# Patient Record
Sex: Male | Born: 1965 | Race: Black or African American | Hispanic: No | Smoking: Current every day smoker
Health system: Southern US, Community
[De-identification: ages and names within clinical notes are randomized; demographics above are authoritative.]

## PROBLEM LIST (undated history)

## (undated) DIAGNOSIS — I10 Essential (primary) hypertension: Secondary | ICD-10-CM

## (undated) DIAGNOSIS — K759 Inflammatory liver disease, unspecified: Secondary | ICD-10-CM

## (undated) HISTORY — PX: BRAIN SURGERY: SHX531

---

## 2006-05-15 ENCOUNTER — Emergency Department (HOSPITAL_COMMUNITY): Admission: EM | Admit: 2006-05-15 | Discharge: 2006-05-15 | Payer: Self-pay | Admitting: Emergency Medicine

## 2006-06-15 ENCOUNTER — Emergency Department (HOSPITAL_COMMUNITY): Admission: EM | Admit: 2006-06-15 | Discharge: 2006-06-15 | Payer: Self-pay | Admitting: Emergency Medicine

## 2006-07-24 ENCOUNTER — Emergency Department (HOSPITAL_COMMUNITY): Admission: EM | Admit: 2006-07-24 | Discharge: 2006-07-24 | Payer: Self-pay | Admitting: Emergency Medicine

## 2007-04-18 ENCOUNTER — Emergency Department (HOSPITAL_COMMUNITY): Admission: EM | Admit: 2007-04-18 | Discharge: 2007-04-18 | Payer: Self-pay | Admitting: Emergency Medicine

## 2007-10-20 ENCOUNTER — Emergency Department (HOSPITAL_COMMUNITY): Admission: EM | Admit: 2007-10-20 | Discharge: 2007-10-20 | Payer: Self-pay | Admitting: Emergency Medicine

## 2008-04-07 ENCOUNTER — Emergency Department (HOSPITAL_COMMUNITY): Admission: EM | Admit: 2008-04-07 | Discharge: 2008-04-07 | Payer: Self-pay | Admitting: Emergency Medicine

## 2008-08-28 ENCOUNTER — Emergency Department (HOSPITAL_COMMUNITY): Admission: EM | Admit: 2008-08-28 | Discharge: 2008-08-28 | Payer: Self-pay | Admitting: Emergency Medicine

## 2008-09-02 ENCOUNTER — Emergency Department (HOSPITAL_COMMUNITY): Admission: EM | Admit: 2008-09-02 | Discharge: 2008-09-02 | Payer: Self-pay | Admitting: Emergency Medicine

## 2008-09-03 ENCOUNTER — Emergency Department (HOSPITAL_COMMUNITY): Admission: EM | Admit: 2008-09-03 | Discharge: 2008-09-03 | Payer: Self-pay | Admitting: Emergency Medicine

## 2008-09-05 ENCOUNTER — Emergency Department (HOSPITAL_COMMUNITY): Admission: EM | Admit: 2008-09-05 | Discharge: 2008-09-05 | Payer: Self-pay | Admitting: Emergency Medicine

## 2008-09-12 ENCOUNTER — Ambulatory Visit: Payer: Self-pay | Admitting: Family Medicine

## 2008-09-12 DIAGNOSIS — G8929 Other chronic pain: Secondary | ICD-10-CM | POA: Insufficient documentation

## 2008-09-12 DIAGNOSIS — M79604 Pain in right leg: Secondary | ICD-10-CM

## 2008-09-12 DIAGNOSIS — M79605 Pain in left leg: Secondary | ICD-10-CM

## 2008-09-13 ENCOUNTER — Emergency Department (HOSPITAL_COMMUNITY): Admission: EM | Admit: 2008-09-13 | Discharge: 2008-09-13 | Payer: Self-pay | Admitting: Emergency Medicine

## 2008-11-15 ENCOUNTER — Emergency Department (HOSPITAL_COMMUNITY): Admission: EM | Admit: 2008-11-15 | Discharge: 2008-11-15 | Payer: Self-pay | Admitting: Emergency Medicine

## 2008-11-19 ENCOUNTER — Emergency Department (HOSPITAL_COMMUNITY): Admission: EM | Admit: 2008-11-19 | Discharge: 2008-11-19 | Payer: Self-pay | Admitting: Emergency Medicine

## 2008-12-07 ENCOUNTER — Emergency Department (HOSPITAL_COMMUNITY): Admission: EM | Admit: 2008-12-07 | Discharge: 2008-12-07 | Payer: Self-pay | Admitting: Emergency Medicine

## 2008-12-22 ENCOUNTER — Emergency Department (HOSPITAL_COMMUNITY): Admission: EM | Admit: 2008-12-22 | Discharge: 2008-12-22 | Payer: Self-pay | Admitting: Emergency Medicine

## 2010-10-13 LAB — GLUCOSE, CAPILLARY: Glucose-Capillary: 85 mg/dL (ref 70–99)

## 2010-10-16 LAB — URINE MICROSCOPIC-ADD ON

## 2010-10-16 LAB — URINALYSIS, ROUTINE W REFLEX MICROSCOPIC
Ketones, ur: NEGATIVE mg/dL
Nitrite: NEGATIVE
Urobilinogen, UA: 1 mg/dL (ref 0.0–1.0)

## 2012-03-25 ENCOUNTER — Emergency Department (HOSPITAL_COMMUNITY)
Admission: EM | Admit: 2012-03-25 | Discharge: 2012-03-25 | Disposition: A | Discharge status: Home / Self Care | Payer: No Typology Code available for payment source | Attending: Emergency Medicine | Admitting: Emergency Medicine

## 2012-03-25 ENCOUNTER — Encounter (HOSPITAL_COMMUNITY): Payer: Self-pay | Admitting: Emergency Medicine

## 2012-03-25 DIAGNOSIS — Y93I9 Activity, other involving external motion: Secondary | ICD-10-CM | POA: Insufficient documentation

## 2012-03-25 DIAGNOSIS — Y998 Other external cause status: Secondary | ICD-10-CM | POA: Insufficient documentation

## 2012-03-25 DIAGNOSIS — F17200 Nicotine dependence, unspecified, uncomplicated: Secondary | ICD-10-CM | POA: Insufficient documentation

## 2012-03-25 DIAGNOSIS — S39012A Strain of muscle, fascia and tendon of lower back, initial encounter: Secondary | ICD-10-CM

## 2012-03-25 DIAGNOSIS — S339XXA Sprain of unspecified parts of lumbar spine and pelvis, initial encounter: Secondary | ICD-10-CM | POA: Insufficient documentation

## 2012-03-25 DIAGNOSIS — Y92410 Unspecified street and highway as the place of occurrence of the external cause: Secondary | ICD-10-CM | POA: Insufficient documentation

## 2012-03-25 MED ORDER — TRAMADOL HCL 50 MG PO TABS
50.0000 mg | ORAL_TABLET | Freq: Once | ORAL | Status: AC
  Administered 2012-03-25: 50 mg via ORAL
  Filled 2012-03-25: qty 1

## 2012-03-25 MED ORDER — TRAMADOL HCL 50 MG PO TABS: 50.0000 mg | ORAL_TABLET | Freq: Once | ORAL | Status: DC

## 2012-03-25 NOTE — ED Notes (Signed)
NP at bedside.
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
## 2012-03-25 NOTE — ED Notes (Signed)
Verified with Manus Rudd, NP Tramadol safe to [employer]

## 2012-03-27 NOTE — ED Provider Notes (Signed)
Medical screening examination/treatment/procedure(s) were performed by non-physician practitioner and as supervising physician I was immediately available for consultation/collaboration.  Flint Melter, MD 03/27/12 [phone]

## 2012-09-15 ENCOUNTER — Emergency Department (HOSPITAL_COMMUNITY): Payer: No Typology Code available for payment source

## 2012-09-15 ENCOUNTER — Emergency Department (HOSPITAL_COMMUNITY)
Admission: EM | Admit: 2012-09-15 | Discharge: 2012-09-15 | Disposition: A | Discharge status: Home / Self Care | Payer: No Typology Code available for payment source | Attending: Emergency Medicine | Admitting: Emergency Medicine

## 2012-09-15 ENCOUNTER — Encounter (HOSPITAL_COMMUNITY): Payer: Self-pay | Admitting: *Deleted

## 2012-09-15 DIAGNOSIS — S0993XA Unspecified injury of face, initial encounter: Secondary | ICD-10-CM | POA: Insufficient documentation

## 2012-09-15 DIAGNOSIS — I10 Essential (primary) hypertension: Secondary | ICD-10-CM

## 2012-09-15 DIAGNOSIS — M542 Cervicalgia: Secondary | ICD-10-CM

## 2012-09-15 DIAGNOSIS — F17200 Nicotine dependence, unspecified, uncomplicated: Secondary | ICD-10-CM | POA: Insufficient documentation

## 2012-09-15 DIAGNOSIS — R51 Headache: Secondary | ICD-10-CM | POA: Insufficient documentation

## 2012-09-15 DIAGNOSIS — Y92410 Unspecified street and highway as the place of occurrence of the external cause: Secondary | ICD-10-CM | POA: Insufficient documentation

## 2012-09-15 DIAGNOSIS — Y939 Activity, unspecified: Secondary | ICD-10-CM | POA: Insufficient documentation

## 2012-09-15 MED ORDER — OXYCODONE-ACETAMINOPHEN 5-325 MG PO TABS: 2.0000 | ORAL_TABLET | ORAL | Status: DC | PRN
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
## 2014-04-03 ENCOUNTER — Telehealth (HOSPITAL_COMMUNITY): Payer: Self-pay | Admitting: *Deleted

## 2014-04-25 ENCOUNTER — Telehealth (HOSPITAL_COMMUNITY): Payer: Self-pay | Admitting: *Deleted

## 2014-12-10 ENCOUNTER — Other Ambulatory Visit: Payer: Self-pay

## 2014-12-10 ENCOUNTER — Encounter (HOSPITAL_COMMUNITY): Payer: Self-pay

## 2014-12-10 ENCOUNTER — Emergency Department (HOSPITAL_COMMUNITY)
Admission: EM | Admit: 2014-12-10 | Discharge: 2014-12-10 | Disposition: A | Discharge status: Home / Self Care | Payer: No Typology Code available for payment source | Attending: Emergency Medicine | Admitting: Emergency Medicine

## 2014-12-10 ENCOUNTER — Emergency Department (HOSPITAL_COMMUNITY): Payer: No Typology Code available for payment source

## 2014-12-10 DIAGNOSIS — R5383 Other fatigue: Secondary | ICD-10-CM | POA: Diagnosis not present

## 2014-12-10 DIAGNOSIS — I159 Secondary hypertension, unspecified: Secondary | ICD-10-CM | POA: Diagnosis not present

## 2014-12-10 DIAGNOSIS — R079 Chest pain, unspecified: Secondary | ICD-10-CM | POA: Insufficient documentation

## 2014-12-10 DIAGNOSIS — Z760 Encounter for issue of repeat prescription: Secondary | ICD-10-CM | POA: Diagnosis not present

## 2014-12-10 DIAGNOSIS — R0602 Shortness of breath: Secondary | ICD-10-CM | POA: Diagnosis not present

## 2014-12-10 HISTORY — DX: Essential (primary) hypertension: I10

## 2014-12-10 LAB — CBC WITH DIFFERENTIAL/PLATELET
Basophils Absolute: 0 10*3/uL (ref 0.0–0.1)
Basophils Relative: 0 % (ref 0–1)
Eosinophils Absolute: 0.1 10*3/uL (ref 0.0–0.7)
Eosinophils Relative: 3 % (ref 0–5)
HCT: 42.9 % (ref 39.0–52.0)
Hemoglobin: 14.7 g/dL (ref 13.0–17.0)
Lymphocytes Relative: 57 % — ABNORMAL HIGH (ref 12–46)
Lymphs Abs: 2.1 10*3/uL (ref 0.7–4.0)
MCH: 30.9 pg (ref 26.0–34.0)
MCHC: 34.3 g/dL (ref 30.0–36.0)
MCV: 90.1 fL (ref 78.0–100.0)
Monocytes Absolute: 0.4 10*3/uL (ref 0.1–1.0)
Monocytes Relative: 11 % (ref 3–12)
Neutro Abs: 1.1 10*3/uL — ABNORMAL LOW (ref 1.7–7.7)
Neutrophils Relative %: 30 % — ABNORMAL LOW (ref 43–77)
Platelets: 194 10*3/uL (ref 150–400)
RBC: 4.76 MIL/uL (ref 4.22–5.81)
RDW: 12.8 % (ref 11.5–15.5)
WBC: 3.7 10*3/uL — ABNORMAL LOW (ref 4.0–10.5)

## 2014-12-10 LAB — BASIC METABOLIC PANEL
Anion gap: 8 (ref 5–15)
BUN: 18 mg/dL (ref 6–20)
CALCIUM: 9.2 mg/dL (ref 8.9–10.3)
CHLORIDE: 105 mmol/L (ref 101–111)
CO2: 24 mmol/L (ref 22–32)
CREATININE: 1.24 mg/dL (ref 0.61–1.24)
GFR calc non Af Amer: >60 mL/min (ref >60)
GLUCOSE: 90 mg/dL (ref 65–99)
POTASSIUM: 4 mmol/L (ref 3.5–5.1)
Sodium: 137 mmol/L (ref 135–145)

## 2014-12-10 LAB — BRAIN NATRIURETIC PEPTIDE: B Natriuretic Peptide: 15.9 pg/mL (ref 0.0–100.0)

## 2014-12-10 LAB — I-STAT TROPONIN, ED
TROPONIN I, POC: 0 ng/mL (ref 0.00–0.08)
Troponin i, poc: 0 ng/mL (ref 0.00–0.08)

## 2014-12-10 LAB — CBC
HCT: 41.8 % (ref 39.0–52.0)
HEMOGLOBIN: 14.4 g/dL (ref 13.0–17.0)
MCH: 30.8 pg (ref 26.0–34.0)
MCHC: 34.4 g/dL (ref 30.0–36.0)
MCV: 89.5 fL (ref 78.0–100.0)
Platelets: 200 10*3/uL (ref 150–400)
RBC: 4.67 MIL/uL (ref 4.22–5.81)
RDW: 12.8 % (ref 11.5–15.5)
WBC: 2.8 10*3/uL — ABNORMAL LOW (ref 4.0–10.5)

## 2014-12-10 MED ORDER — HYDROCHLOROTHIAZIDE 25 MG PO TABS
25.0000 mg | ORAL_TABLET | Freq: Once | ORAL | Status: AC
  Administered 2014-12-10: 25 mg via ORAL
  Filled 2014-12-10: qty 1

## 2014-12-10 MED ORDER — KETOROLAC TROMETHAMINE 30 MG/ML IJ SOLN
30.0000 mg | Freq: Once | INTRAMUSCULAR | Status: AC
  Administered 2014-12-10: 30 mg via INTRAVENOUS
  Filled 2014-12-10: qty 1

## 2014-12-10 MED ORDER — SODIUM CHLORIDE 0.9 % IV BOLUS (SEPSIS)
1000.0000 mL | Freq: Once | INTRAVENOUS | Status: AC
  Administered 2014-12-10: 1000 mL via INTRAVENOUS

## 2014-12-10 MED ORDER — HYDROCHLOROTHIAZIDE 25 MG PO TABS: 25.0000 mg | ORAL_TABLET | Freq: Every day | ORAL | Status: DC

## 2014-12-10 NOTE — ED Notes (Signed)
Pt states he was jogging last night and started having left sided cp with some SOB. Nausea at times and a headache at present

## 2014-12-10 NOTE — ED Provider Notes (Signed)
CSN: [id number]     Arrival date & time 12/10/14  1425 History   First MD Initiated Contact with Patient 12/10/14 1910     Chief Complaint  Patient presents with  . Chest Pain     (Consider location/radiation/quality/duration/timing/severity/associated sxs/prior Treatment) HPI Pt is a 49yo male with hx of HTN, presenting to ED with c/o left sided chest pain with associated SOB yesterday while he was jogging last night.  Pain was sharp and aching, 5/10 at worst, lasted several minutes but resolved with rest.  Pt states this morning when he woke up he had generalized fatigue and generalized headache that was gradual in onset so he called out of work.  HA feels similar to previous. Pt does state he feels the SOB yesterday was due to smoking as he smokes cigarettes daily.  Denies CP or SOB today.  Denies prior hx of CAD. Denies hx of asthma, DVT or PE.  Denies fever, chills, n/v/d. Denies recent travel or sick contacts. No known tick bites. Denies rashes.   Pt found to have elevated BP in triage, BP-170/109.  Pt states he has been off his BP medication for at least 2 months as he states he works a lot and has not made time to f/u with his PCP.     Past Medical History  Diagnosis Date  . Hypertension    History reviewed. No pertinent past surgical history. No family history on file. History  Substance Use Topics  . Smoking status: Current Every Day Smoker  . Smokeless tobacco: Never Used  . Alcohol Use: Yes    Review of Systems  Constitutional: Positive for fatigue. Negative for fever, chills, diaphoresis and appetite change.  Respiratory: Positive for shortness of breath. Negative for cough and stridor.   Cardiovascular: Positive for chest pain. Negative for palpitations and leg swelling.  Gastrointestinal: Negative for nausea, vomiting, abdominal pain and diarrhea.  Musculoskeletal: Negative for myalgias and arthralgias.  Skin: Negative for rash.  All other systems reviewed and are  negative.     Allergies  Codeine  Home Medications   Prior to Admission medications   Medication Sig Start Date End Date Taking? Authorizing Provider  acetaminophen (TYLENOL) 500 MG tablet Take 500 mg by mouth every 6 (six) hours as needed for pain.   Yes Historical Provider, MD  ibuprofen (ADVIL,MOTRIN) 200 MG tablet Take 400 mg by mouth every 6 (six) hours as needed for mild pain.   Yes Historical Provider, MD  Tetrahydrozoline HCl (VISINE OP) Apply 1 drop to eye daily as needed (for dry eyes).   Yes Historical Provider, MD  hydrochlorothiazide (HYDRODIURIL) 25 MG tablet Take 1 tablet (25 mg total) by mouth daily. 12/10/14   Junius Finner, PA-C   BP 167/98 mmHg  Pulse 66  Temp(Src) 98.2 F (36.8 C) (Oral)  Resp 20  Ht  (1.803 m)  Wt 184 lb 9.6 oz (83.734 kg)  BMI 25.76 kg/m2  SpO2 99% Physical Exam  Constitutional: He appears well-developed and well-nourished.  Pt lying in exam bed, appears well, NAD  HENT:  Head: Normocephalic and atraumatic.  Eyes: Conjunctivae are normal. No scleral icterus.  Neck: Normal range of motion. Neck supple.  Cardiovascular: Normal rate, regular rhythm and normal heart sounds.   Pulmonary/Chest: Effort normal and breath sounds normal. No respiratory distress. He has no wheezes. He has no rales. He exhibits no tenderness.  Abdominal: Soft. Bowel sounds are normal. He exhibits no distension and no mass. There is no tenderness.  There is no rebound and no guarding.  Musculoskeletal: Normal range of motion.  Neurological: He is alert.  Skin: Skin is warm and dry.  Nursing note and vitals reviewed.   ED Course  Procedures (including critical care time) Labs Review Labs Reviewed  CBC - Abnormal; Notable for the following:    WBC 2.8 (*)    All other components within normal limits  CBC WITH DIFFERENTIAL/PLATELET - Abnormal; Notable for the following:    WBC 3.7 (*)    Neutrophils Relative % 30 (*)    Neutro Abs 1.1 (*)    Lymphocytes  Relative 57 (*)    All other components within normal limits  BASIC METABOLIC PANEL  BRAIN NATRIURETIC PEPTIDE  I-STAT TROPOININ, ED  I-STAT TROPOININ, ED    Imaging Review Dg Chest 2 View  12/10/2014   CLINICAL DATA:  Left-sided chest pain shortness of breath wall jogging last night. Nausea at times. Headache.  EXAM: CHEST  2 VIEW  COMPARISON:  09/13/2008  FINDINGS: The cardiomediastinal silhouette is within normal limits. The lungs are well inflated and clear. There is no evidence of pleural effusion or pneumothorax. No acute osseous abnormality is identified.  IMPRESSION: No active cardiopulmonary disease.   Electronically Signed   By: Sebastian AcheAllen  Grady   On: 12/10/2014 15:42     EKG Interpretation   Date/Time:  Monday December 10 2014 14:41:55 EDT Ventricular Rate:  84 PR Interval:  130 QRS Duration: 86 QT Interval:  366 QTC Calculation: 432 R Axis:   81 Text Interpretation:  Normal sinus rhythm Right atrial enlargement  Borderline ECG No old tracing to compare Confirmed by BELFI  MD, MELANIE  ([id number]) on 12/10/2014 8:16:49 PM      MDM   Final diagnoses:  Chest pain, unspecified chest pain type  Secondary hypertension, unspecified  Medication refill    Pt is a 49yo male hx of HTN, c/o CP yesterday and generalized fatigue today.  Mild HA today that was gradual in onset. [facility]. No focal neuro deficit. Not concerned for CVA/TIA.  Pt is low risk for ASC per HEART score. PERC negative.  EKG: NSR istat troponin negative x2 Initial CBC with WBC was 2.8, repeat labs of CBC with Diff, WBC-3.7, only mild leuopenia, doubt cause of pt's fatigue.  Pt was hypertensive in triage with BP of 170/109, BP improved to 167/98 while in ED. Pt reports being off his medication for at least 2 months due to work being busy, he has not had time to f/u with PCP.   Pt is safe for discharge home. No evidence of emergent process taking place at this time. Pt hemodynamically stable. Filed  Vitals:   12/10/14 1919  BP: 167/98  Pulse: 66  Temp: Temp: 98.40F at 17:08  Resp: 20   Strongly encouraged pt to f/u with PCP for ongoing healthcare needs including continued maintenance of his BP. Return precautions provided. Pt verbalized understanding and agreement with tx plan.     Junius Finnerrin O'Malley, PA-C 12/10/14 2034  Rolan BuccoMelanie Belfi, MD 12/10/14 [phone]

## 2014-12-10 NOTE — Discharge Instructions (Signed)

## 2017-04-27 ENCOUNTER — Ambulatory Visit (HOSPITAL_COMMUNITY)
Admission: RE | Admit: 2017-04-27 | Discharge: 2017-04-27 | Disposition: A | Discharge status: Home / Self Care | Payer: Medicaid Other | Source: Ambulatory Visit | Attending: Family Medicine | Admitting: Family Medicine

## 2017-04-27 ENCOUNTER — Ambulatory Visit (INDEPENDENT_AMBULATORY_CARE_PROVIDER_SITE_OTHER): Payer: Self-pay | Admitting: Family Medicine

## 2017-04-27 ENCOUNTER — Encounter: Payer: Self-pay | Admitting: Family Medicine

## 2017-04-27 VITALS — BP 140/100 | HR 83 | Temp 97.6°F | Ht 71.0 in | Wt 205.0 lb

## 2017-04-27 DIAGNOSIS — M79604 Pain in right leg: Secondary | ICD-10-CM

## 2017-04-27 DIAGNOSIS — R071 Chest pain on breathing: Secondary | ICD-10-CM

## 2017-04-27 DIAGNOSIS — Z7689 Persons encountering health services in other specified circumstances: Secondary | ICD-10-CM | POA: Insufficient documentation

## 2017-04-27 DIAGNOSIS — G8929 Other chronic pain: Secondary | ICD-10-CM

## 2017-04-27 DIAGNOSIS — R918 Other nonspecific abnormal finding of lung field: Secondary | ICD-10-CM | POA: Insufficient documentation

## 2017-04-27 DIAGNOSIS — M79605 Pain in left leg: Secondary | ICD-10-CM

## 2017-04-27 DIAGNOSIS — I10 Essential (primary) hypertension: Secondary | ICD-10-CM | POA: Insufficient documentation

## 2017-04-27 DIAGNOSIS — F17210 Nicotine dependence, cigarettes, uncomplicated: Secondary | ICD-10-CM

## 2017-04-27 NOTE — Assessment & Plan Note (Signed)
Previously treated with ibuprofen and tylenol. We discussed having a separate appointment for this in the future after work up for his chest pain. He rated this as the most important thing for him to address at the clinic today.

## 2017-04-27 NOTE — Assessment & Plan Note (Signed)
Atypical chestpain. Seems pleuritic in nature. Uncontrolled hypertension raises concerns for increased risk for CAD and ACS event. Possibly angina, but seems to be very active with work without inciting pain. CHF can cause pleuritic chest pain. Infection such as PNA seems unlikely given no focal lung findings on exam and no fever or cough. PE can also not be ruled out, although he denies any lower extremity pain/seems very mobile so risk for DVT seems very low. GERD can also intermittent chest pain, but not usually pleuritic. Will get a chest xray to rule out cardiomegaly, PNA, hiatal hernia, malignancy. Will have pt come back in 1 week to further explore. Consider EKG, BNP.

## 2017-04-27 NOTE — Assessment & Plan Note (Signed)
Patient endorsed wanting to quit. Motivation father died of lung cancer from smoking and aunt died of COPD. Possible evidence of COPD from prior cxr, but did not appreciate bronchovesicular breath sounds on exam. I would very much like to explore smoking cessation options at a future visit dedicated to this.

## 2017-04-27 NOTE — Assessment & Plan Note (Addendum)
Elevated BP today at 140/100. Will have pt come back in 1 week to discuss restarting HCTZ vs a new agent.

## 2017-04-27 NOTE — Patient Instructions (Signed)
It was a pleasure to see you today! Thank you for choosing [facility] for your primary care. Lawrence Cole was seen to establish care. [name]. Come back to the clinic in one week to further discuss your chest pain and to address your hypertension.  Best,  Thomes DinningBrad Azhane Eckart, MD, MS FAMILY MEDICINE RESIDENT - PGY1 04/27/2017 10:53 AM   Lab Orders     Basic Metabolic Panel     Lipid panel     HIV antibody (with reflex)     Hepatitis C Antibody

## 2017-04-27 NOTE — Progress Notes (Signed)
Subjective:  Lawrence Cole is a 51 y.o. male who presents to the [facility] today to establish care.   HPI: HTN: Diagnosed approximately 2 years ago. Previously on HCTZ. Has been unmediated for 2 years.   Chest pain: Pain in chest with deep breathing in midchest. H/o smoking cigarrettes. Chest pain Been going on for past 3-4 months. Not getting worse. Pain is worse when smoking a cigarette and deep breaths. [place] for [place]. 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 Does not have any chest pain during this time.   H/o of headache. No headache currently. Daily. No vision changes. No vomiting with h/a.   Smoking: ~25 pack year history of smoking. Currently smoking. Father died of lung cancer. Aunt had COPD. CXR from 2 years ago did not have signs of acute cardiopulmonary disease. Does appear to have hyperinflated lungs with some diaphragmatic flattening and blunting of the corners. Interested in quiting smoking  ROS: As per HPI, otherwise all systems reviewed and are negative  PMH:  The following were reviewed and entered/updated in epic: Past Medical History:  Diagnosis Date  . Hypertension    Patient Active Problem List   Diagnosis Date Noted  . Chest pain on breathing 04/27/2017  . Essential hypertension 04/27/2017  . Establishing care with new doctor, encounter for 04/27/2017  . Smoking greater than 25 pack years 04/27/2017  . Chronic pain of lower extremity, bilateral 09/12/2008   Past Surgical History:  Procedure Laterality Date  . BRAIN SURGERY Right    right temporal plate and maxillary hardware?    Family History  Problem Relation Age of Onset  . Asthma Mother   . Diabetes Mother   . Hypertension Mother   . Cancer Father   . Hypertension Father     Medications- reviewed and updated Current Outpatient Prescriptions  Medication Sig Dispense Refill  . acetaminophen (TYLENOL) 500 MG tablet Take 500 mg by  mouth every 6 (six) hours as needed for pain.    . hydrochlorothiazide (HYDRODIURIL) 25 MG tablet Take 1 tablet (25 mg total) by mouth daily. 30 tablet 1  . ibuprofen (ADVIL,MOTRIN) 200 MG tablet Take 400 mg by mouth every 6 (six) hours as needed for mild pain.    . Tetrahydrozoline HCl (VISINE OP) Apply 1 drop to eye daily as needed (for dry eyes).     No current facility-administered medications for this visit.     Allergies-reviewed and updated Allergies  Allergen Reactions  . Codeine Itching    Social History   Social History  . Marital status: Divorced    Spouse name: N/A  . Number of children: N/A  . Years of education: N/A   Social History Main Topics  . Smoking status: Current Every Day Smoker    Packs/day: 1.00    Years: 25.00  . Smokeless tobacco: Never Used  . Alcohol use Yes     Comment: 4/day  . Drug use: Yes    Frequency: 14.0 times per week    Types: Marijuana     Comment: joints. Been doing for 15-20 years.   . Sexual activity: Yes    Birth control/ protection: Condom     Comment: With women   Other Topics Concern  . None   Social History Narrative  . None     Objective:  Physical Exam: BP (!) 140/100 (BP Location: Left Arm, Patient Position: Sitting, Cuff Size: Normal)   Pulse  83   Temp 97.6 F (36.4 C) (Oral)   Ht 5\' 11"  (1.803 m)   Wt 205 lb (93 kg)   SpO2 97%   BMI 28.59 kg/m   Gen: NAD, resting comfortably CV: RRR with no murmurs appreciated, 2+ dp, <3 sec cap refill. No clubbing of the nails Pulm: NWOB, CTAB with no crackles, wheezes, or rhonchi GI: Normal bowel sounds present. Soft, Nontender, Nondistended. MSK: no edema, cyanosis, or clubbing noted Skin: warm, dry Neuro: grossly normal, moves all extremities Psych: Normal affect and thought content  No results found for this or any previous visit (from the past 72 hour(s)).   Assessment/Plan:  Essential hypertension Elevated BP today at 140/100. Will have pt come back in 1  week to discuss starting a knew agent.   Chronic pain of lower extremity, bilateral Previously treated with ibuprofen and tylenol. We discussed having a separate appointment for this in the future after work up for his chest pain. He rated this as the most important thing for him to address at the clinic today.   Chest pain on breathing Atypical chestpain. Seems pleuritic in nature. Uncontrolled hypertension raises concerns for increased risk for CAD and ACS event. Possibly angina, but seems to be very active with work without inciting pain. CHF can cause pleuritic chest pain. Infection such as PNA seems unlikely given no focal lung findings on exam and no fever or cough. PE can also not be ruled out, although he denies any lower extremity pain/seems very mobile so risk for DVT seems very low. GERD can also intermittent chest pain, but not usually pleuritic. Will get a chest xray to rule out cardiomegaly, PNA, hiatal hernia, malignancy. Will have pt come back in 1 week to further explore. Consider EKG, BNP.   Smoking greater than 25 pack years Patient endorsed wanting to quit. Motivation father died of lung cancer from smoking and aunt died of COPD. Possible evidence of COPD from prior cxr, but did not appreciate bronchovesicular breath sounds on exam. I would very much like to explore smoking cessation options at a future visit dedicated to this.     Lab Orders     Basic Metabolic Panel     Lipid panel     HIV antibody (with reflex)     Hepatitis C Antibody  No orders of the defined types were placed in this encounter.   Thomes Dinning, MD, MS FAMILY MEDICINE RESIDENT - PGY1 04/27/2017 12:39 PM

## 2017-04-28 LAB — BASIC METABOLIC PANEL
BUN / CREAT RATIO: 14 (ref 9–20)
BUN: 23 mg/dL (ref 6–24)
CO2: 26 mmol/L (ref 20–29)
CREATININE: 1.59 mg/dL — AB (ref 0.76–1.27)
Calcium: 9.4 mg/dL (ref 8.7–10.2)
Chloride: 103 mmol/L (ref 96–106)
GFR, EST AFRICAN AMERICAN: 56 mL/min/{1.73_m2} — AB (ref >59)
GFR, EST NON AFRICAN AMERICAN: 49 mL/min/{1.73_m2} — AB (ref >59)
GLUCOSE: 89 mg/dL (ref 65–99)
Potassium: 4.3 mmol/L (ref 3.5–5.2)
SODIUM: 143 mmol/L (ref 134–144)

## 2017-04-28 LAB — LIPID PANEL
CHOLESTEROL TOTAL: 136 mg/dL (ref 100–199)
Chol/HDL Ratio: 2.7 ratio (ref 0.0–5.0)
HDL: 50 mg/dL (ref >39)
LDL CALC: 66 mg/dL (ref 0–99)
TRIGLYCERIDES: 102 mg/dL (ref 0–149)
VLDL Cholesterol Cal: 20 mg/dL (ref 5–40)

## 2017-04-28 LAB — HIV ANTIBODY (ROUTINE TESTING W REFLEX): HIV Screen 4th Generation wRfx: NONREACTIVE

## 2017-04-28 LAB — HEPATITIS C ANTIBODY

## 2017-04-30 ENCOUNTER — Other Ambulatory Visit: Payer: Self-pay | Admitting: Family Medicine

## 2017-04-30 DIAGNOSIS — Z9189 Other specified personal risk factors, not elsewhere classified: Secondary | ICD-10-CM

## 2017-04-30 DIAGNOSIS — R768 Other specified abnormal immunological findings in serum: Secondary | ICD-10-CM | POA: Insufficient documentation

## 2017-04-30 MED ORDER — ROSUVASTATIN CALCIUM 20 MG PO TABS: 20.0000 mg | ORAL_TABLET | Freq: Every day | ORAL | 3 refills | Status: DC

## 2017-04-30 MED ORDER — ASPIRIN 81 MG PO CHEW: 81.0000 mg | CHEWABLE_TABLET | Freq: Once | ORAL | Status: DC

## 2017-04-30 NOTE — Assessment & Plan Note (Signed)
Pt tested positive for hepatitis C. Will refer to GI for further management. Will order LFTs to get baseline liver function.

## 2017-04-30 NOTE — Assessment & Plan Note (Signed)
ASCVD score 10.8%. Will start statin and aspirin therapy. Will discuss starting antihypertensive agent at next visit.

## 2017-05-03 ENCOUNTER — Other Ambulatory Visit: Payer: Self-pay | Admitting: Family Medicine

## 2017-05-03 ENCOUNTER — Encounter: Payer: Self-pay | Admitting: *Deleted

## 2017-05-03 DIAGNOSIS — R768 Other specified abnormal immunological findings in serum: Secondary | ICD-10-CM

## 2017-05-03 NOTE — Progress Notes (Signed)
Letter mailed to patient. Jazmin Hartsell,CMA  

## 2017-05-31 ENCOUNTER — Ambulatory Visit (INDEPENDENT_AMBULATORY_CARE_PROVIDER_SITE_OTHER): Payer: Self-pay | Admitting: Family Medicine

## 2017-05-31 ENCOUNTER — Other Ambulatory Visit: Payer: Self-pay

## 2017-05-31 ENCOUNTER — Encounter: Payer: Self-pay | Admitting: Family Medicine

## 2017-05-31 VITALS — BP 150/88 | HR 87 | Temp 99.2°F | Ht 71.0 in | Wt 203.0 lb

## 2017-05-31 DIAGNOSIS — B1920 Unspecified viral hepatitis C without hepatic coma: Secondary | ICD-10-CM | POA: Insufficient documentation

## 2017-05-31 DIAGNOSIS — F1010 Alcohol abuse, uncomplicated: Secondary | ICD-10-CM

## 2017-05-31 DIAGNOSIS — E785 Hyperlipidemia, unspecified: Secondary | ICD-10-CM

## 2017-05-31 DIAGNOSIS — I10 Essential (primary) hypertension: Secondary | ICD-10-CM

## 2017-05-31 LAB — POCT UA - GLUCOSE/PROTEIN
GLUCOSE UA: NEGATIVE
PROTEIN UA: NEGATIVE

## 2017-05-31 MED ORDER — ASPIRIN EC 81 MG PO TBEC: 81.0000 mg | DELAYED_RELEASE_TABLET | Freq: Every day | ORAL | 3 refills | Status: DC

## 2017-05-31 MED ORDER — ATORVASTATIN CALCIUM 40 MG PO TABS: 40.0000 mg | ORAL_TABLET | Freq: Every day | ORAL | 3 refills | Status: DC

## 2017-05-31 MED ORDER — HYDRALAZINE HCL 25 MG PO TABS: 25.0000 mg | ORAL_TABLET | Freq: Three times a day (TID) | ORAL | 2 refills | Status: DC

## 2017-05-31 NOTE — Assessment & Plan Note (Signed)
Getting LFTs, coag panel to assess liver function. Will address drinking as patient continues to explore hep C treatments.

## 2017-05-31 NOTE — Assessment & Plan Note (Addendum)
Pt has > 10% ASCVD risk score. Will start on atorvastatin 40 mg. This is on [facility] $4 list. Will also start on 81 mg aspirin

## 2017-05-31 NOTE — Assessment & Plan Note (Addendum)
Tested positive for ab. Will repete reflex testing to verify testing. ID referal to Hep C clinic already placed. Gave pt number and advised him to call to set up appointment. Will also test hep B. Counseled patient on modes of transmission of hep C and safety practices to prevent further transmission. Counseled against alcohol use due to increased risk of liver damage.

## 2017-05-31 NOTE — Progress Notes (Signed)
Subjective:  Lawrence Cole is a 51 y.o. 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 Has not used any IV drugs since. He only did crack and heroin "3-4" times. He has been to prison 6 years ago. Says that he remembers being told he had something wrong with his liver. Does not remember, but thinks it was Hep C.   Alcohol abuse Drinks 6 22 oz Co[place] Beers a day. ABV 8%. Wants to quit drinking. Does not want to do AA. He has done it in the past and does not feel that it helped.   ROS: Per HPI  PMH: Smoking history reviewed. Pt reports smoking a pack a day. Want to quit.    Objective:  Physical Exam: BP (!) 150/88   Pulse 87   Temp 99.2 F (37.3 C) (Oral)   Ht 5\' 11"  (1.803 m)   Wt 203 lb (92.1 kg)   SpO2 95%   BMI 28.31 kg/m   Gen: NAD, resting comfortably CV: RRR with no murmurs appreciated Pulm: NWOB, CTAB with no crackles, wheezes, or rhonchi GI: Normal bowel sounds present. Soft, Nontender, Nondistended. MSK: no edema, cyanosis, or clubbing noted Skin: warm, dry Neuro: grossly normal, moves all extremities Psych: Normal affect and thought content  No results found for this or any previous visit (from the past 72 hour(s)).   Assessment/Plan:  Hepatitis C virus infection without hepatic coma Tested positive for ab. Will repete reflex testing to verify testing. ID referal to Hep C clinic already placed. Gave pt number and advised him to call to set up appointment. Will also test hep B. Counseled patient on modes of transmission of hep C and safety practices to prevent further transmission. Counseled against alcohol use due to increased risk of liver damage.   Essential hypertension Pt has elevated BP and AKI on last lab work. Pt can not afford most medications. Will start on hydralazine 25 mg given AKI. Will follow up at next appointment in 1 mo.    Alcohol abuse Getting LFTs, coag panel to assess liver function. Will address drinking as patient continues to explore hep C treatments.   Hyperlipidemia Pt has > 10% ASCVD risk score. Will start on atorvastatin 40 mg. This is on [facility] $4 list.   Patient reports lack of resources. Will refer to social work to help with medication cost.   Pt had trace blood on urine dip. No protein or s/s of infection.    Lab Orders     Hepatic Function Panel     Coagulation Panel     Hepatitis c antibody (reflex)     Hep B Core Ab W/Reflex     Urinalysis - Glucose/Protein  Meds ordered this encounter  Medications  . DISCONTD: atorvastatin (LIPITOR) 40 MG tablet    Sig: Take 1 tablet (40 mg total) by mouth daily.    Dispense:  90 tablet    Refill:  3  . hydrALAZINE (APRESOLINE) 25 MG tablet    Sig: Take 1 tablet (25 mg total) by mouth 3 (three) times daily.    Dispense:  90 tablet    Refill:  2  . atorvastatin (LIPITOR) 40 MG tablet    Sig: Take 1 tablet (40 mg total) by mouth daily.    Dispense:  90 tablet    Refill:  3  Thomes DinningBrad Shawntina Diffee, MD, MS FAMILY MEDICINE RESIDENT - PGY1 05/31/2017 4:39 PM

## 2017-05-31 NOTE — Patient Instructions (Signed)
It was a pleasure to see you today! Thank you for choosing [facility] for your primary care. Lawrence Cole was seen for hepatitis C positive antibodies. We are getting more lab work for you to look at your liver. Please call the numbers provided to talk to social work and to set up your Hep C appointment.  Come back to the clinic in one month to follow up on your lab work.  Best,  [name], MD, MS FAMILY MEDICINE RESIDENT - PGY1 05/31/2017 3:46 PM

## 2017-05-31 NOTE — Assessment & Plan Note (Addendum)
Pt has elevated BP and AKI on last lab work. Pt can not afford most medications. Will start on hydralazine 25 mg given AKI. Will follow up at next appointment in 1 mo.

## 2017-06-03 ENCOUNTER — Encounter: Payer: Self-pay | Admitting: Family Medicine

## 2017-06-03 NOTE — Progress Notes (Signed)
Called patient. Unable to get patient. Will send letter

## 2017-06-04 ENCOUNTER — Telehealth: Payer: Self-pay | Admitting: Licensed Clinical Social Worker

## 2017-06-04 NOTE — Progress Notes (Signed)
Type of Service: Clinical Social Work  LCSW returned phone call from patient.  Patient was at work. Left message with patient's mother that LCSW returned his call   Plan: LCSW will wait for patient to call back.  Sammuel Hineseborah Asta Corbridge, LCSW Licensed Clinical Social Worker [facility]   [phone] 12:06 PM

## 2017-06-08 ENCOUNTER — Encounter: Payer: Medicaid Other | Admitting: Internal Medicine

## 2017-06-08 LAB — COAGULATION PANEL
ANTICARDIOLIPIN IGM: 9 [MPL'U]/mL (ref 0–12)
ANTITHROMB III FUNC: 110 % (ref 75–135)
APTT PPP: 26.9 s (ref 22.9–30.2)
AT III AG PPP IMM-ACNC: 102 % (ref 72–124)
DPT CONFIRM RATIO: 0.91 ratio (ref 0.00–1.40)
Dilute Viper Venom Time: 30.2 s (ref 0.0–47.0)
Factor V Activity: 162 % — ABNORMAL HIGH (ref 70–150)
HOMOCYSTEINE: 14.7 umol/L (ref 0.0–15.0)
INR: 1.1 (ref 0.9–1.1)
PROTEIN C ACTIVITY: 102 % (ref 73–180)
PROTEIN C ANTIGEN: 94 % (ref 60–150)
PROTEIN S AG FREE: 51 % — AB (ref 57–157)
PROTEIN S AG TOTAL: 82 % (ref 60–150)
PT: 11.4 s (ref 9.6–11.5)
PTT Lupus Anticoagulant: 34 s (ref 0.0–51.9)
Protein S Activity: 76 % (ref 63–140)
THROMBIN TIME: 20.4 s (ref 0.0–23.0)
dPT: 41.6 s (ref 0.0–55.0)

## 2017-06-08 LAB — COMMENT2 - HEP PANEL

## 2017-06-08 LAB — HEPATIC FUNCTION PANEL
ALT: 97 IU/L — AB (ref 0–44)
AST: 73 IU/L — AB (ref 0–40)
Albumin: 4.5 g/dL (ref 3.5–5.5)
Alkaline Phosphatase: 48 IU/L (ref 39–117)
BILIRUBIN TOTAL: 0.6 mg/dL (ref 0.0–1.2)
Bilirubin, Direct: 0.2 mg/dL (ref 0.00–0.40)
Total Protein: 7.7 g/dL (ref 6.0–8.5)

## 2017-06-08 LAB — HBCIGM: Hep B C IgM: NEGATIVE

## 2017-06-08 LAB — HEPATITIS C ANTIBODY (REFLEX): HCV Ab: >11 s/co ratio — ABNORMAL HIGH (ref 0.0–0.9)

## 2017-06-08 LAB — HEPATITIS B CORE AB W/REFLEX: HEP B C TOTAL AB: POSITIVE — AB

## 2017-07-15 ENCOUNTER — Ambulatory Visit (INDEPENDENT_AMBULATORY_CARE_PROVIDER_SITE_OTHER): Payer: Self-pay | Admitting: Internal Medicine

## 2017-07-15 ENCOUNTER — Encounter: Payer: Self-pay | Admitting: Internal Medicine

## 2017-07-15 DIAGNOSIS — B182 Chronic viral hepatitis C: Secondary | ICD-10-CM

## 2017-07-16 LAB — HEPATITIS B SURFACE ANTIBODY,QUALITATIVE: Hep B S Ab: NONREACTIVE

## 2017-07-16 LAB — CBC WITH DIFFERENTIAL/PLATELET
BASOS PCT: 0.5 %
Basophils Absolute: 19 cells/uL (ref 0–200)
EOS ABS: 152 {cells}/uL (ref 15–500)
Eosinophils Relative: 4.1 %
HEMATOCRIT: 41.3 % (ref 38.5–50.0)
HEMOGLOBIN: 14 g/dL (ref 13.2–17.1)
LYMPHS ABS: 1580 {cells}/uL (ref 850–3900)
MCH: 31.3 pg (ref 27.0–33.0)
MCHC: 33.9 g/dL (ref 32.0–36.0)
MCV: 92.2 fL (ref 80.0–100.0)
MPV: 8.8 fL (ref 7.5–12.5)
Monocytes Relative: 7.3 %
Neutro Abs: 1680 cells/uL (ref 1500–7800)
Neutrophils Relative %: 45.4 %
PLATELETS: 268 10*3/uL (ref 140–400)
RBC: 4.48 10*6/uL (ref 4.20–5.80)
RDW: 11.7 % (ref 11.0–15.0)
TOTAL LYMPHOCYTE: 42.7 %
WBC: 3.7 10*3/uL — ABNORMAL LOW (ref 3.8–10.8)
WBCMIX: 270 {cells}/uL (ref 200–950)

## 2017-07-16 LAB — COMPLETE METABOLIC PANEL WITH GFR
AG Ratio: 1.3 (calc) (ref 1.0–2.5)
ALBUMIN MSPROF: 4 g/dL (ref 3.6–5.1)
ALT: 38 U/L (ref 9–46)
AST: 31 U/L (ref 10–35)
Alkaline phosphatase (APISO): 38 U/L — ABNORMAL LOW (ref 40–115)
BUN / CREAT RATIO: 11 (calc) (ref 6–22)
BUN: 15 mg/dL (ref 7–25)
CALCIUM: 9.4 mg/dL (ref 8.6–10.3)
CO2: 29 mmol/L (ref 20–32)
CREATININE: 1.34 mg/dL — AB (ref 0.70–1.33)
Chloride: 105 mmol/L (ref 98–110)
GFR, EST NON AFRICAN AMERICAN: 60 mL/min/{1.73_m2} (ref >=60)
GFR, Est African American: 70 mL/min/{1.73_m2} (ref >=60)
GLOBULIN: 3.1 g/dL (ref 1.9–3.7)
GLUCOSE: 81 mg/dL (ref 65–99)
Potassium: 4.2 mmol/L (ref 3.5–5.3)
SODIUM: 141 mmol/L (ref 135–146)
Total Bilirubin: 0.8 mg/dL (ref 0.2–1.2)
Total Protein: 7.1 g/dL (ref 6.1–8.1)

## 2017-07-16 LAB — HEPATITIS B CORE ANTIBODY, TOTAL: Hep B Core Total Ab: REACTIVE — AB

## 2017-07-16 LAB — HIV ANTIBODY (ROUTINE TESTING W REFLEX): HIV 1&2 Ab, 4th Generation: NONREACTIVE

## 2017-07-16 LAB — HEPATITIS B SURFACE ANTIGEN: Hepatitis B Surface Ag: NONREACTIVE

## 2017-07-16 LAB — PROTIME-INR
INR: 1
Prothrombin Time: 11 s (ref 9.0–11.5)

## 2017-07-16 LAB — HEPATITIS A ANTIBODY, TOTAL: Hepatitis A AB,Total: REACTIVE — AB

## 2017-07-16 NOTE — Progress Notes (Signed)
[facility]   CC: consideration for treatment for chronic hepatitis C  HPI:  +Lawrence Cole is a 52 y.o. male who presents for initial evaluation and management of chronic hepatitis C.  Patient tested positive while in prison. Hepatitis C-associated risk factors present are: IV drug abuse (details: remote use of IV drugs). Patient denies history of blood transfusion, sexual contact with person with liver disease, tattoos. Patient has had other studies performed. Results: hepatitis C RNA by PCR, result: positive. Patient has not had prior treatment for Hepatitis C. Patient does not have a past history of liver disease. Patient does not have a family history of liver disease. Patient does not  have associated signs or symptoms related to liver disease.  Labs reviewed and confirm chronic hepatitis C with a positive viral load.   Records reviewed from Epic, He does have a significant alcohol history but cutting down.   Does have Hepatitis B core positive, no Ab test yet done.       Patient does not have documented immunity to Hepatitis A. Patient does not have documented immunity to Hepatitis B.    Review of Systems:  Constitutional: negative for fatigue and malaise Gastrointestinal: negative for diarrhea Integument/breast: negative for rash Musculoskeletal: negative for myalgias and arthralgias All other systems reviewed and are negative       Past Medical History:  Diagnosis Date  . Hypertension     Prior to Admission medications   Medication Sig Start Date End Date Taking? Authorizing Provider  acetaminophen (TYLENOL) 500 MG tablet Take 500 mg by mouth every 6 (six) hours as needed for pain.    [provider]  aspirin EC 81 MG tablet Take 1 tablet (81 mg total) by mouth daily. 05/31/17   Garnette Gunnerhompson, Aaron B, MD  atorvastatin (LIPITOR) 40 MG tablet Take 1 tablet (40 mg total) by mouth daily. 05/31/17   Garnette Gunnerhompson, Aaron B, MD  hydrALAZINE  (APRESOLINE) 25 MG tablet Take 1 tablet (25 mg total) by mouth 3 (three) times daily. 05/31/17 08/29/17  Garnette Gunnerhompson, Aaron B, MD  ibuprofen (ADVIL,MOTRIN) 200 MG tablet Take 400 mg by mouth every 6 (six) hours as needed for mild pain.    [provider]  rosuvastatin (CRESTOR) 20 MG tablet Take 1 tablet (20 mg total) by mouth daily. 04/30/17 07/29/17  Garnette Gunnerhompson, Aaron B, MD  Tetrahydrozoline HCl (VISINE OP) Apply 1 drop to eye daily as needed (for dry eyes).    [provider]    Allergies  Allergen Reactions  . Codeine Itching    Social History   Tobacco Use  . Smoking status: Current Every Day Smoker    Packs/day: 1.00    Years: 25.00    Pack years: 25.00  . Smokeless tobacco: Never Used  Substance Use Topics  . Alcohol use: Yes    Comment: 4/day  . Drug use: Yes    Frequency: 14.0 times per week    Types: Marijuana    Comment: joints. Been doing for 15-20 years.     Family History  Problem Relation Age of Onset  . Asthma Mother   . Diabetes Mother   . Hypertension Mother   . Cancer Father   . Hypertension Father      Objective:  Constitutional: in no apparent distress,  Eyes: anicteric Cardiovascular: Cor RRR and No murmurs Respiratory: CTA B; normal respiratory effort Gastrointestinal: Bowel sounds are normal, liver is not enlarged, spleen is not enlarged Musculoskeletal: no  pedal edema noted Skin: negatives: no rash; no porphyria cutanea tarda Lymphatic: no cervical lymphadenopathy   Laboratory Genotype: No results found for: HCVGENOTYPE HCV viral load: No results found for: HCVQUANT Lab Results  Component Value Date   WBC 3.7 (L) 07/15/2017   HGB 14.0 07/15/2017   HCT 41.3 07/15/2017   MCV 92.2 07/15/2017   PLT 268 07/15/2017    Lab Results  Component Value Date   CREATININE 1.34 (H) 07/15/2017   BUN 15 07/15/2017   NA 141 07/15/2017   K 4.2 07/15/2017   CL 105 07/15/2017   CO2 29 07/15/2017    Lab Results  Component Value Date     ALT 38 07/15/2017   AST 31 07/15/2017   ALKPHOS 48 05/31/2017     Labs and history reviewed and show CHILD-PUGH unknown  5-6 points: Child class A 7-9 points: Child class B 10-15 points: Child class C  Lab Results  Component Value Date   INR 1.0 07/15/2017   BILITOT 0.8 07/15/2017   ALBUMIN 4.5 05/31/2017     Assessment: New Patient with Chronic Hepatitis C genotype unknown, untreated.  I discussed with the patient the lab findings that confirm chronic hepatitis C as well as the natural history and progression of disease including about 30% of people who develop cirrhosis of the liver if left untreated and once cirrhosis is established there is a 2-7% risk per year of liver cancer and liver failure.  I discussed the importance of treatment and benefits in reducing the risk, even if significant liver fibrosis exists.   Plan: 1) Patient counseled extensively on limiting acetaminophen to no more than 2 grams daily, avoidance of alcohol. 2) Transmission discussed with patient including sexual transmission, sharing razors and toothbrush.   3) Will need referral to gastroenterology if concern for cirrhosis 4) Will need referral for substance abuse counseling: No.; Further work up to include urine drug screen  No. 5) Will prescribe appropriate medication based on genotype and coverage on his return visit.  6) Hepatitis A and B titers 7) Pneumovax vaccine next visit 9) Further work up to include liver staging with elastography 10) will follow up after above to discuss treatment options

## 2017-07-17 LAB — HEPATITIS C RNA QUANTITATIVE
HCV Quantitative Log: 6.13 Log IU/mL — ABNORMAL HIGH
HCV RNA, PCR, QN: 1350000 IU/mL — ABNORMAL HIGH

## 2017-07-19 LAB — HEPATITIS C GENOTYPE

## 2017-07-21 ENCOUNTER — Ambulatory Visit (INDEPENDENT_AMBULATORY_CARE_PROVIDER_SITE_OTHER): Payer: Self-pay | Admitting: Family Medicine

## 2017-07-21 ENCOUNTER — Other Ambulatory Visit: Payer: Self-pay

## 2017-07-21 ENCOUNTER — Encounter: Payer: Self-pay | Admitting: Family Medicine

## 2017-07-21 VITALS — BP 124/84 | HR 80 | Temp 98.8°F | Ht 71.0 in | Wt 201.0 lb

## 2017-07-21 DIAGNOSIS — I10 Essential (primary) hypertension: Secondary | ICD-10-CM

## 2017-07-21 DIAGNOSIS — R1013 Epigastric pain: Secondary | ICD-10-CM | POA: Insufficient documentation

## 2017-07-21 DIAGNOSIS — F1010 Alcohol abuse, uncomplicated: Secondary | ICD-10-CM

## 2017-07-21 DIAGNOSIS — E785 Hyperlipidemia, unspecified: Secondary | ICD-10-CM

## 2017-07-21 DIAGNOSIS — K219 Gastro-esophageal reflux disease without esophagitis: Secondary | ICD-10-CM | POA: Insufficient documentation

## 2017-07-21 LAB — LIVER FIBROSIS, FIBROTEST-ACTITEST
ALT: 39 U/L (ref 9–46)
Alpha-2-Macroglobulin: 351 mg/dL — ABNORMAL HIGH (ref 106–279)
Apolipoprotein A1: 155 mg/dL (ref 94–176)
Bilirubin: 0.5 mg/dL (ref 0.2–1.2)
Fibrosis Score: 0.6
GGT: 98 U/L — ABNORMAL HIGH (ref 3–95)
Haptoglobin: 122 mg/dL (ref 43–212)
Necroinflammat ACT Score: 0.3
REFERENCE ID: 2284432

## 2017-07-21 MED ORDER — OMEPRAZOLE 40 MG PO CPDR: 40.0000 mg | DELAYED_RELEASE_CAPSULE | Freq: Every day | ORAL | 3 refills | Status: DC

## 2017-07-21 MED ORDER — POLYETHYLENE GLYCOL 3350 17 GM/SCOOP PO POWD: 17.0000 g | Freq: Two times a day (BID) | ORAL | 1 refills | Status: DC | PRN

## 2017-07-21 NOTE — Patient Instructions (Signed)
It was a pleasure to see you today! Thank you for choosing [facility] for your primary care. Lawrence Cole was seen for blood pressure check. Take Miralax for constipation. Start trial of omeprazole for abdominal pain. Come back to the clinic in  6 months for blood pressure check. Come back to clinic earlier if abdominal pain does not improve. Go to the emergency room if you develop severe abdominal pain, nausea, vomiting or blood in stool.   Meds ordered this encounter  Medications  . polyethylene glycol powder (GLYCOLAX/MIRALAX) powder    Sig: Take 17 g by mouth 2 (two) times daily as needed.    Dispense:  3350 g    Refill:  1  . omeprazole (PRILOSEC) 40 MG capsule    Sig: Take 1 capsule (40 mg total) by mouth daily.    Dispense:  30 capsule    Refill:  3    Best,  [name], MD, MS FAMILY MEDICINE RESIDENT - PGY1 07/21/2017 4:45 PM

## 2017-07-21 NOTE — Assessment & Plan Note (Addendum)
BP well controlled on hydralazine.  He is tolerating this without any major side effects.  Continue on current therapy.  Have patient follow-up in 6 months with blood pressure recheck.  May be experiencing constipation as a side effect of hydralazine will prescribe MiraLAX taken as needed.

## 2017-07-21 NOTE — Assessment & Plan Note (Signed)
Patient endorses mild epigastric abdominal pain that he sometimes feels with deep breathing.  Patient is hemodynamically stable and does not have any dyspnea.  I do not think that this is cardiac or pulmonary related. Exhibits mild epigastric abdominal tenderness on exam symptoms consistent with GERD.  Pain does not radiate to the back.  Patient has a history of GERD, will trial omeprazole and have patient come back as needed for pain.

## 2017-07-21 NOTE — Assessment & Plan Note (Signed)
Patient has elevated ASCVD risk based on hyperlipidemia greater than 10%.  But given his diagnosis of hepatitis A, B, C, history of transaminitis, and degree of fibrotic cirrhosis, I want to abstain from continuing any statin therapy until after hepatitis C treatment as which she is about to start through infectious disease.  Consider discussion about statin therapy after this.

## 2017-07-21 NOTE — Assessment & Plan Note (Signed)
Patient has significantly decreased his alcohol intake to only 1 beer within the past 2 weeks.  He has not experienced any clinical signs or symptoms of withdrawal.  He was continue on this pathway without trial of any medication or therapy at this time.  However provided resources for alcohol and drug addiction in the community at lower no cost.  Complement patient on his current progress and discuss clinical signs of withdrawal with strict return precautions to our clinic or the ED depending on signs.  Will follow-up at next appointment to discuss current status.

## 2017-07-21 NOTE — Progress Notes (Signed)
Subjective:  Lawrence Cole is a 52 y.o. male who presents to the [facility] today with blood pressure follow-up and abdominal pain.   HPI:   Hypertension Well-controlled on hydralazine which he is taking 3 times a day.  Not experiencing any chest pain shortness of breath dizziness.    Abdominal pain Endorses some mild abdominal discomfort particularly when smoking cigarette and constipation.  He has a bowel movement 1 every other day.  He attributes his abdominal pain to starting when he started taking the hydralazine.  Said that for a couple few weeks after first starting it he woke up with headache and and mild vomiting.  This is since resolved and he is tolerating his hydralazine well.  The abdominal pain is epigastric, and occurs when smoking cigarettes early in the morning.  It does not happen throughout the day.  Not associated with hematochezia or melena. Alcohol use disorder Patient stopped drinking alcohol, except one beer Christmas.  He was drinking around 80 ounces of beer a day.  He denies any shaking, tremors, hallucinations, chest pain, dizziness, sweats.  He said that he had one bad reaction with taking the hydralazine with alcohol and since stopped drinking.  Smoking Patient is still smoking and wants to quit, but feels that he wants to focus on quitting alcohol.  Hepatitis A, B C Patient has been to ID in early January and has started workup for hepatitis C.  He also had further lab testing to identify strain of hepatitis B and had hepatic LS tomography that showed some degree of fibrosis consistent with cirrhosis.    ROS: Per HPI  PMH: Smoking history reviewed.    Objective:  Physical Exam: BP 124/84   Pulse 80   Temp 98.8 F (37.1 C) (Oral)   Ht 5\' 11"  (1.803 m)   Wt 201 lb (91.2 kg)   SpO2 96%   BMI 28.03 kg/m   Gen: NAD, resting comfortably CV: RRR with no murmurs appreciated Pulm: NWOB, CTAB with no crackles, wheezes, or rhonchi GI: Mild epigastric  tenderness, soft, Nontender, Nondistended. MSK: no edema, cyanosis, or clubbing noted Skin: warm, dry Neuro: grossly normal, moves all extremities Psych: Normal affect and thought content  No results found for this or any previous visit (from the past 72 hour(s)).   Assessment/Plan:  Essential hypertension BP well controlled on hydralazine.  He is tolerating this without any major side effects.  Continue on current therapy.  Have patient follow-up in 6 months with blood pressure recheck.  May be experiencing constipation as a side effect of hydralazine will prescribe MiraLAX taken as needed.  Alcohol abuse Patient has significantly decreased his alcohol intake to only 1 beer within the past 2 weeks.  He has not experienced any clinical signs or symptoms of withdrawal.  He was continue on this pathway without trial of any medication or therapy at this time.  However provided resources for alcohol and drug addiction in the community at lower no cost.  Complement patient on his current progress and discuss clinical signs of withdrawal with strict return precautions to our clinic or the ED depending on signs.  Will follow-up at next appointment to discuss current status.  Hyperlipidemia Patient has elevated ASCVD risk based on hyperlipidemia greater than 10%.  But given his diagnosis of hepatitis A, B, C, history of transaminitis, and degree of fibrotic cirrhosis, I want to abstain from continuing any statin therapy until after hepatitis C treatment as which she is about to  start through infectious disease.  Consider discussion about statin therapy after this.  Abdominal pain, epigastric Patient endorses mild epigastric abdominal pain that he sometimes feels with deep breathing.  Patient is hemodynamically stable and does not have any dyspnea.  I do not think that this is cardiac or pulmonary related. Exhibits mild epigastric abdominal tenderness on exam symptoms consistent with GERD.  Pain does not  radiate to the back.  Patient has a history of GERD, will trial omeprazole and have patient come back as needed for pain.   Lab Orders  No laboratory test(s) ordered today    Meds ordered this encounter  Medications  . polyethylene glycol powder (GLYCOLAX/MIRALAX) powder    Sig: Take 17 g by mouth 2 (two) times daily as needed.    Dispense:  3350 g    Refill:  1  . omeprazole (PRILOSEC) 40 MG capsule    Sig: Take 1 capsule (40 mg total) by mouth daily.    Dispense:  30 capsule    Refill:  3    [name], MD, MS FAMILY MEDICINE RESIDENT - PGY1 07/21/2017 5:07 PM

## 2017-07-29 ENCOUNTER — Other Ambulatory Visit: Payer: Self-pay | Admitting: Internal Medicine

## 2017-07-29 DIAGNOSIS — B182 Chronic viral hepatitis C: Secondary | ICD-10-CM

## 2017-08-10 ENCOUNTER — Telehealth: Payer: Self-pay | Admitting: Family Medicine

## 2017-08-10 NOTE — Telephone Encounter (Signed)
PCP never refilled this patients hydalazine. Please send it to the [facility] on [address].

## 2017-08-17 ENCOUNTER — Ambulatory Visit: Payer: Medicaid Other | Admitting: Internal Medicine

## 2017-08-19 ENCOUNTER — Ambulatory Visit (HOSPITAL_COMMUNITY)
Admission: RE | Admit: 2017-08-19 | Discharge: 2017-08-19 | Disposition: A | Discharge status: Home / Self Care | Payer: Self-pay | Source: Ambulatory Visit | Attending: Internal Medicine | Admitting: Internal Medicine

## 2017-08-19 DIAGNOSIS — B182 Chronic viral hepatitis C: Secondary | ICD-10-CM | POA: Insufficient documentation

## 2017-08-26 ENCOUNTER — Other Ambulatory Visit: Payer: Self-pay | Admitting: Pharmacist Clinician (PhC)/ Clinical Pharmacy Specialist

## 2017-08-26 ENCOUNTER — Other Ambulatory Visit: Payer: Self-pay | Admitting: Pharmacist

## 2017-08-26 MED ORDER — GLECAPREVIR-PIBRENTASVIR 100-40 MG PO TABS: 3.0000 | ORAL_TABLET | Freq: Every day | ORAL | 1 refills | Status: DC

## 2017-08-26 NOTE — Progress Notes (Signed)
Change to Mavy[place] due to interactions

## 2017-08-27 ENCOUNTER — Other Ambulatory Visit: Payer: Self-pay | Admitting: Pharmacist

## 2017-09-01 ENCOUNTER — Ambulatory Visit (INDEPENDENT_AMBULATORY_CARE_PROVIDER_SITE_OTHER): Payer: Self-pay | Admitting: Pharmacist

## 2017-09-01 DIAGNOSIS — B182 Chronic viral hepatitis C: Secondary | ICD-10-CM

## 2017-09-01 NOTE — Progress Notes (Signed)
HPI: Lawrence Cole is a 52 y.o. male who presented to clinic today to sign paperwork for [facility] and start Mavyret for HepC infection.  Lab Results  Component Value Date   HCVGENOTYPE 1a 07/15/2017    Allergies: Allergies  Allergen Reactions  . Codeine Itching    Vitals:    Past Medical History: Past Medical History:  Diagnosis Date  . Hypertension     Social History: Social History   Socioeconomic History  . Marital status: Divorced    Spouse name: Not on file  . Number of children: Not on file  . Years of education: Not on file  . Highest education level: Not on file  Social Needs  . Financial resource strain: Not on file  . Food insecurity - worry: Not on file  . Food insecurity - inability: Not on file  . Transportation needs - medical: Not on file  . Transportation needs - non-medical: Not on file  Occupational History  . Not on file  Tobacco Use  . Smoking status: Current Every Day Smoker    Packs/day: 1.00    Years: 25.00    Pack years: 25.00  . Smokeless tobacco: Never Used  Substance and Sexual Activity  . Alcohol use: Yes    Comment: 4/day  . Drug use: Yes    Frequency: 14.0 times per week    Types: Marijuana    Comment: joints. Been doing for 15-20 years.   . Sexual activity: Yes    Birth control/protection: Condom    Comment: With women  Other Topics Concern  . Not on file  Social History Narrative  . Not on file    Labs: Hep B S Ab (no units)  Date Value  07/15/2017 NON-REACTIVE   Hepatitis B Surface Ag (no units)  Date Value  07/15/2017 NON-REACTIVE    Lab Results  Component Value Date   HCVGENOTYPE 1a 07/15/2017    Hepatitis C RNA quantitative Latest Ref Rng & Units 07/15/2017  HCV Quantitative Log NOT DETECT Log IU/mL 6.13(H)    AST  Date Value  07/15/2017 31 U/L  05/31/2017 73 IU/L (H)   ALT  Date Value  07/15/2017 38 U/L  07/15/2017 39 U/L  05/31/2017 97 IU/L (H)   INR (no units)  Date Value   07/15/2017 1.0  05/31/2017 1.1    CrCl: CrCl cannot be calculated (Patient's most recent lab result is older than the maximum 21 days allowed.).  Fibrosis Score: F3 as assessed by Fibrosure   Child-Pugh Score: A  Previous Treatment Regimen: None  Assessment: Lawrence Cole came to clinic today to see pharmacy to sign paperwork for [facility] coverage of Mavyret. He saw Dr. Luciana Axeomer on 1/10 when he was diagnosed with HCV. He has a history of IV drug use. His other medications consist of ibuprofen and hydralazine for blood pressure. Today pharmacy helped him complete paperwork and counseled him on starting Mavyret. I discussed taking 3 pills at one time once daily, and potential side effects of GI upset, HA, and fatigue. He will return for monitoring in 1 month.   Recommendations: -Initiate Mavyret for 8-week treatment -Follow up in 1 month  [name], [address] BoulevardPharm.D. Candidate [employer] [facility] 09/01/2017, 10:13 AM

## 2017-09-09 ENCOUNTER — Telehealth: Payer: Self-pay | Admitting: Pharmacist Clinician (PhC)/ Clinical Pharmacy Specialist

## 2017-09-09 NOTE — Telephone Encounter (Signed)
Lawrence StallionGeorge has been approved for Mavyret through patient assistance. They will ship it directly to him. Lawrence Bustardold [name] to hold his Lipitor x 2 months when he starts his Mavyret. He understood.

## 2017-09-28 ENCOUNTER — Encounter: Payer: Self-pay | Admitting: Pharmacy Technician

## 2017-10-19 ENCOUNTER — Ambulatory Visit (INDEPENDENT_AMBULATORY_CARE_PROVIDER_SITE_OTHER): Payer: Self-pay | Admitting: Pharmacist Clinician (PhC)/ Clinical Pharmacy Specialist

## 2017-10-19 DIAGNOSIS — B182 Chronic viral hepatitis C: Secondary | ICD-10-CM

## 2017-10-19 NOTE — Progress Notes (Addendum)
HPI: Lawrence Cole is a 52 y.o. male here for his follow up hepatitis C visit. He is about 1 month in to treatment with Mavryet.  Lab Results  Component Value Date   HCVGENOTYPE 1a 07/15/2017    Allergies: Allergies  Allergen Reactions  . Codeine Itching    Vitals:    Past Medical History: Past Medical History:  Diagnosis Date  . Hypertension     Social History: Social History   Socioeconomic History  . Marital status: Divorced    Spouse name: Not on file  . Number of children: Not on file  . Years of education: Not on file  . Highest education level: Not on file  Occupational History  . Not on file  Social Needs  . Financial resource strain: Not on file  . Food insecurity:    Worry: Not on file    Inability: Not on file  . Transportation needs:    Medical: Not on file    Non-medical: Not on file  Tobacco Use  . Smoking status: Current Every Day Smoker    Packs/day: 1.00    Years: 25.00    Pack years: 25.00  . Smokeless tobacco: Never Used  Substance and Sexual Activity  . Alcohol use: Yes    Comment: 4/day  . Drug use: Yes    Frequency: 14.0 times per week    Types: Marijuana    Comment: joints. Been doing for 15-20 years.   . Sexual activity: Yes    Birth control/protection: Condom    Comment: With women  Lifestyle  . Physical activity:    Days per week: Not on file    Minutes per session: Not on file  . Stress: Not on file  Relationships  . Social connections:    Talks on phone: Not on file    Gets together: Not on file    Attends religious service: Not on file    Active member of club or organization: Not on file    Attends meetings of clubs or organizations: Not on file    Relationship status: Not on file  Other Topics Concern  . Not on file  Social History Narrative  . Not on file    Labs: Hep B S Ab (no units)  Date Value  07/15/2017 NON-REACTIVE   Hepatitis B Surface Ag (no units)  Date Value  07/15/2017 NON-REACTIVE     Lab Results  Component Value Date   HCVGENOTYPE 1a 07/15/2017    Hepatitis C RNA quantitative Latest Ref Rng & Units 07/15/2017  HCV Quantitative Log NOT DETECT Log IU/mL 6.13(H)    AST  Date Value  07/15/2017 31 U/L  05/31/2017 73 IU/L (H)   ALT  Date Value  07/15/2017 38 U/L  07/15/2017 39 U/L  05/31/2017 97 IU/L (H)   INR (no units)  Date Value  07/15/2017 1.0  05/31/2017 1.1    CrCl: CrCl cannot be calculated (Patient's most recent lab result is older than the maximum 21 days allowed.).  Fibrosis Score: F3 as assessed by Fibrosure   Child-Pugh Score: A  Previous Treatment Regimen: none  Assessment: Lawrence Cole is here today for his 1 month Hepatitis C follow up visit. He denies any missed doses of Mavyret (he even dropped one in the trash and dug it out to take it) and endorses taking all 3 tablets at once. He says his next bottle will be delivered to him tomorrow. The only side effect he reports is fatigue. He has  not sent in the [facility] financial assistance form, so he was provided with another copy and he says he will make sure to send it now. We will defer labs until patient assistance is approved.  Recommendations: Continue Mavyret for total 8 weeks Await patient assistance approval Defer labs until EOT visit 5/22   Kathyrn Sheriff, PharmD PGY1 Pharmacy Resident [facility] for Infectious Disease 10/19/17 4:33 PM  Unfortunately, he has not sent in the [facility] assistance form. We will wait for the labs at the EOT visit. We will see him for the cure visit. All appts have been set up.  Ulyses Southward, PharmD, BCPS, AAHIVP, CPP Infectious Disease Pharmacist Pager: [phone] 10/20/2017 12:19 AM

## 2017-11-24 ENCOUNTER — Ambulatory Visit (INDEPENDENT_AMBULATORY_CARE_PROVIDER_SITE_OTHER): Payer: Self-pay | Admitting: Pharmacist

## 2017-11-24 DIAGNOSIS — B182 Chronic viral hepatitis C: Secondary | ICD-10-CM

## 2017-11-24 NOTE — Progress Notes (Signed)
HPI: Lawrence Cole is a 52 y.o. male who presents to the [facility] clinic for his EOT Hep C follow-up.  He has genotype 1a, F2/F3, and completed 8 weeks of Mavyret last Friday.  Patient Active Problem List   Diagnosis Date Noted  . GERD (gastroesophageal reflux disease) 07/21/2017  . Abdominal pain, epigastric 07/21/2017  . Hyperlipidemia 05/31/2017  . Hepatitis C virus infection without hepatic coma 05/31/2017  . Alcohol abuse 05/31/2017  . At risk for acute ischemic cardiac event 04/30/2017  . Hepatitis C antibody test positive 04/30/2017  . Chest pain on breathing 04/27/2017  . Essential hypertension 04/27/2017  . Establishing care with new doctor, encounter for 04/27/2017  . Smoking greater than 25 pack years 04/27/2017  . Chronic pain of lower extremity, bilateral 09/12/2008    Patient's Medications  New Prescriptions   No medications on file  Previous Medications   ACETAMINOPHEN (TYLENOL) 500 MG TABLET    Take 500 mg by mouth every 6 (six) hours as needed for pain.   ASPIRIN EC 81 MG TABLET    Take 1 tablet (81 mg total) by mouth daily.   ATORVASTATIN (LIPITOR) 40 MG TABLET    Take 1 tablet (40 mg total) by mouth daily.   GLECAPREVIR-PIBRENTASVIR (MAVYRET) 100-40 MG TABS    Take 3 tablets by mouth daily with breakfast.   HYDRALAZINE (APRESOLINE) 25 MG TABLET    Take 1 tablet (25 mg total) by mouth 3 (three) times daily.   IBUPROFEN (ADVIL,MOTRIN) 200 MG TABLET    Take 400 mg by mouth every 6 (six) hours as needed for mild pain.   OMEPRAZOLE (PRILOSEC) 40 MG CAPSULE    Take 1 capsule (40 mg total) by mouth daily.   POLYETHYLENE GLYCOL POWDER (GLYCOLAX/MIRALAX) POWDER    Take 17 g by mouth 2 (two) times daily as needed.   TETRAHYDROZOLINE HCL (VISINE OP)    Apply 1 drop to eye daily as needed (for dry eyes).  Modified Medications   No medications on file  Discontinued Medications   No medications on file    Allergies: Allergies  Allergen Reactions  . Codeine  Itching    Past Medical History: Past Medical History:  Diagnosis Date  . Hypertension     Social History: Social History   Socioeconomic History  . Marital status: Divorced    Spouse name: Not on file  . Number of children: Not on file  . Years of education: Not on file  . Highest education level: Not on file  Occupational History  . Not on file  Social Needs  . Financial resource strain: Not on file  . Food insecurity:    Worry: Not on file    Inability: Not on file  . Transportation needs:    Medical: Not on file    Non-medical: Not on file  Tobacco Use  . Smoking status: Current Every Day Smoker    Packs/day: 1.00    Years: 25.00    Pack years: 25.00  . Smokeless tobacco: Never Used  Substance and Sexual Activity  . Alcohol use: Yes    Comment: 4/day  . Drug use: Yes    Frequency: 14.0 times per week    Types: Marijuana    Comment: joints. Been doing for 15-20 years.   . Sexual activity: Yes    Birth control/protection: Condom    Comment: With women  Lifestyle  . Physical activity:    Days per week: Not on file  Minutes per session: Not on file  . Stress: Not on file  Relationships  . Social connections:    Talks on phone: Not on file    Gets together: Not on file    Attends religious service: Not on file    Active member of club or organization: Not on file    Attends meetings of clubs or organizations: Not on file    Relationship status: Not on file  Other Topics Concern  . Not on file  Social History Narrative  . Not on file    Labs: Hepatitis C Lab Results  Component Value Date   HCVGENOTYPE 1a 07/15/2017   HCVRNAPCRQN 1,350,000 (H) 07/15/2017   FIBROSTAGE F3 07/15/2017   Hepatitis B Lab Results  Component Value Date   HEPBSAB NON-REACTIVE 07/15/2017   HEPBSAG NON-REACTIVE 07/15/2017   HEPBCAB REACTIVE (A) 07/15/2017   Hepatitis A Lab Results  Component Value Date   HAV REACTIVE (A) 07/15/2017   HIV Lab Results  Component  Value Date   HIV NON-REACTIVE 07/15/2017   HIV Non Reactive 04/27/2017   Lab Results  Component Value Date   CREATININE 1.34 (H) 07/15/2017   CREATININE 1.59 (H) 04/27/2017   CREATININE 1.24 12/10/2014   Lab Results  Component Value Date   AST 31 07/15/2017   AST 73 (H) 05/31/2017   ALT 38 07/15/2017   ALT 39 07/15/2017   ALT 97 (H) 05/31/2017   INR 1.0 07/15/2017   INR 1.1 05/31/2017    Fibrosis Score: F2/F3 as assessed by [facility]   Assessment: Lawrence Cole is here today to follow-up for his Hep C infection.  He completed 8 weeks of Mavyret last Friday with no issues.  He did not miss any doses and tolerated it fine. He did get fatigued while taking it. We have given him [facility] financial applications three different times but he still has not filled it out.  I had him fill out a [facility] financial application today and turn it into the lab so that we can get labs.  Will get a Hep C VL and CMET today.  His cure visits are already set up.  Plan: - Completed 8 weeks of Mavyret - Hep C VL and CMET today - F/u with lab for [facility] 8/21 at 10am - F/u with [place] for cure visit 8/27 at 10am  Carvin Almas L. Tonita Bills, PharmD, AAHIVP, CPP Infectious Diseases Clinical Pharmacist [facility] 11/24/2017, 4:12 PM

## 2017-11-25 LAB — COMPREHENSIVE METABOLIC PANEL
AG Ratio: 1.4 (calc) (ref 1.0–2.5)
ALKALINE PHOSPHATASE (APISO): 53 U/L (ref 40–115)
ALT: 9 U/L (ref 9–46)
AST: 18 U/L (ref 10–35)
Albumin: 4.1 g/dL (ref 3.6–5.1)
BILIRUBIN TOTAL: 0.4 mg/dL (ref 0.2–1.2)
BUN/Creatinine Ratio: 11 (calc) (ref 6–22)
BUN: 17 mg/dL (ref 7–25)
CALCIUM: 9.7 mg/dL (ref 8.6–10.3)
CHLORIDE: 107 mmol/L (ref 98–110)
CO2: 31 mmol/L (ref 20–32)
Creat: 1.6 mg/dL — ABNORMAL HIGH (ref 0.70–1.33)
GLOBULIN: 2.9 g/dL (ref 1.9–3.7)
GLUCOSE: 82 mg/dL (ref 65–99)
POTASSIUM: 4.2 mmol/L (ref 3.5–5.3)
SODIUM: 143 mmol/L (ref 135–146)
Total Protein: 7 g/dL (ref 6.1–8.1)

## 2017-11-27 LAB — HEPATITIS C RNA QUANTITATIVE
HCV Quantitative Log: <1.18 Log IU/mL
HCV RNA, PCR, QN: <15 IU/mL

## 2017-12-08 ENCOUNTER — Emergency Department (HOSPITAL_COMMUNITY): Payer: Medicaid Other

## 2017-12-08 ENCOUNTER — Emergency Department (HOSPITAL_COMMUNITY)
Admission: EM | Admit: 2017-12-08 | Discharge: 2017-12-08 | Disposition: A | Discharge status: Home / Self Care | Payer: Medicaid Other | Attending: Emergency Medicine | Admitting: Emergency Medicine

## 2017-12-08 ENCOUNTER — Encounter (HOSPITAL_COMMUNITY): Payer: Self-pay | Admitting: Emergency Medicine

## 2017-12-08 DIAGNOSIS — G8929 Other chronic pain: Secondary | ICD-10-CM

## 2017-12-08 DIAGNOSIS — R310 Gross hematuria: Secondary | ICD-10-CM | POA: Insufficient documentation

## 2017-12-08 DIAGNOSIS — M546 Pain in thoracic spine: Secondary | ICD-10-CM

## 2017-12-08 HISTORY — DX: Inflammatory liver disease, unspecified: K75.9

## 2017-12-08 LAB — CBC
HEMATOCRIT: 43.4 % (ref 39.0–52.0)
HEMOGLOBIN: 14.1 g/dL (ref 13.0–17.0)
MCH: 30.1 pg (ref 26.0–34.0)
MCHC: 32.5 g/dL (ref 30.0–36.0)
MCV: 92.7 fL (ref 78.0–100.0)
Platelets: 226 10*3/uL (ref 150–400)
RBC: 4.68 MIL/uL (ref 4.22–5.81)
RDW: 13.3 % (ref 11.5–15.5)
WBC: 5.6 10*3/uL (ref 4.0–10.5)

## 2017-12-08 LAB — COMPREHENSIVE METABOLIC PANEL
ALBUMIN: 4.1 g/dL (ref 3.5–5.0)
ALT: 14 U/L — ABNORMAL LOW (ref 17–63)
AST: 21 U/L (ref 15–41)
Alkaline Phosphatase: 53 U/L (ref 38–126)
Anion gap: 9 (ref 5–15)
BUN: 17 mg/dL (ref 6–20)
CO2: 26 mmol/L (ref 22–32)
Calcium: 9.5 mg/dL (ref 8.9–10.3)
Chloride: 104 mmol/L (ref 101–111)
Creatinine, Ser: 1.53 mg/dL — ABNORMAL HIGH (ref 0.61–1.24)
GFR calc Af Amer: 58 mL/min — ABNORMAL LOW (ref >60)
GFR, EST NON AFRICAN AMERICAN: 50 mL/min — AB (ref >60)
GLUCOSE: 96 mg/dL (ref 65–99)
POTASSIUM: 4 mmol/L (ref 3.5–5.1)
Sodium: 139 mmol/L (ref 135–145)
Total Bilirubin: 1.1 mg/dL (ref 0.3–1.2)
Total Protein: 7.9 g/dL (ref 6.5–8.1)

## 2017-12-08 LAB — URINALYSIS, MICROSCOPIC (REFLEX)
RBC / HPF: >50 RBC/hpf (ref 0–5)
SQUAMOUS EPITHELIAL / LPF: NONE SEEN (ref 0–5)

## 2017-12-08 LAB — URINALYSIS, ROUTINE W REFLEX MICROSCOPIC: Protein, ur: NEGATIVE mg/dL

## 2017-12-08 MED ORDER — ACETAMINOPHEN 500 MG PO TABS
1000.0000 mg | ORAL_TABLET | Freq: Once | ORAL | Status: AC
  Administered 2017-12-08: 1000 mg via ORAL
  Filled 2017-12-08: qty 2

## 2017-12-08 MED ORDER — KETOROLAC TROMETHAMINE 15 MG/ML IJ SOLN
15.0000 mg | Freq: Once | INTRAMUSCULAR | Status: AC
  Administered 2017-12-08: 15 mg via INTRAMUSCULAR
  Filled 2017-12-08: qty 1

## 2017-12-08 MED ORDER — TAMSULOSIN HCL 0.4 MG PO CAPS: 0.4000 mg | ORAL_CAPSULE | Freq: Every day | ORAL | 0 refills | Status: DC

## 2017-12-08 MED ORDER — MORPHINE SULFATE 15 MG PO TABS: 15.0000 mg | ORAL_TABLET | ORAL | 0 refills | Status: DC | PRN

## 2017-12-08 MED ORDER — OXYCODONE HCL 5 MG PO TABS
5.0000 mg | ORAL_TABLET | Freq: Once | ORAL | Status: AC
  Administered 2017-12-08: 5 mg via ORAL
  Filled 2017-12-08: qty 1

## 2017-12-08 MED ORDER — DIAZEPAM 5 MG PO TABS
5.0000 mg | ORAL_TABLET | Freq: Once | ORAL | Status: AC
  Administered 2017-12-08: 5 mg via ORAL
  Filled 2017-12-08: qty 1

## 2017-12-08 NOTE — ED Notes (Signed)
Called 3x, no response.  

## 2017-12-08 NOTE — ED Triage Notes (Addendum)
Pt states 3 days of blood in urine with left sided flank pain radiating around to the front. Also states rectal pain. Pt states he recently completed "Maverick for his liver for 8 weeks." Pt states not frequent urination and no pain with urination. Denies nausea vomiting or diarrhea.

## 2017-12-08 NOTE — Discharge Instructions (Signed)
Take tylenol 1000mg(2 extra strength) four times a day.  ° °Then take the pain medicine if you feel like you need it. Narcotics do not help with the pain, they only make you care about it less.  You can become addicted to this, people may break into your house to steal it.  It will constipate you.  If you drive under the influence of this medicine you can get a DUI.   ° °

## 2017-12-08 NOTE — ED Provider Notes (Signed)
Patient placed in Quick Look pathway, seen and evaluated   Chief Complaint Hematuria  HPI:   52 year old male with a h/o of hepatitis C s/p Maverick presenting with reddish/brown-colored urine with left flank pain radiating to the groin x3 days. . Pain comes and goes.  No known aggravating or alleviating factors.  Pain has remained in the same location since onset.  He also endorses rectal pain. No N/V/D.  No fever or chills.  No history of similar symptoms. No melena or hematochezia.  ROS: Left flank pain,, hematuria, rectal pain   Physical Exam:   Gen: No distress  Neuro: Awake and Alert  Skin: Warm    Focused Exam: Tender to palpation in the suprapubic area.  No rebound or guarding.  No right-sided abdominal tenderness.  No right CVA tenderness.  No left CVA tenderness, but the patient has pain to the left flank.  Abdomen is soft, nondistended.  Initiation of care has begun. The patient has been counseled on the process, plan, and necessity for staying for the completion/evaluation, and the remainder of the medical screening examination   Barkley BoardsMcDonald, Rollie Hynek A, PA-C 12/08/17 1606  Blane OharaZavitz, Joshua, MD 12/08/17 [phone]

## 2017-12-08 NOTE — ED Notes (Signed)
Called for Pt, Pt's family states "he is at the food truck, getting food".

## 2018-01-21 ENCOUNTER — Telehealth: Payer: Self-pay | Admitting: Family Medicine

## 2018-01-21 NOTE — Telephone Encounter (Signed)
Called mobile number in chart, out of service. Called home number, it just rings. Please assist pt with making a bp check appt.

## 2018-02-23 ENCOUNTER — Other Ambulatory Visit: Payer: Medicaid Other

## 2018-02-23 ENCOUNTER — Other Ambulatory Visit: Payer: Self-pay | Admitting: *Deleted

## 2018-02-23 DIAGNOSIS — B1920 Unspecified viral hepatitis C without hepatic coma: Secondary | ICD-10-CM

## 2018-02-25 LAB — COMPLETE METABOLIC PANEL WITH GFR
AG Ratio: 1.2 (calc) (ref 1.0–2.5)
ALBUMIN MSPROF: 4.1 g/dL (ref 3.6–5.1)
ALKALINE PHOSPHATASE (APISO): 56 U/L (ref 40–115)
ALT: 8 U/L — ABNORMAL LOW (ref 9–46)
AST: 18 U/L (ref 10–35)
BILIRUBIN TOTAL: 0.8 mg/dL (ref 0.2–1.2)
BUN / CREAT RATIO: 10 (calc) (ref 6–22)
BUN: 16 mg/dL (ref 7–25)
CHLORIDE: 103 mmol/L (ref 98–110)
CO2: 28 mmol/L (ref 20–32)
Calcium: 9.7 mg/dL (ref 8.6–10.3)
Creat: 1.63 mg/dL — ABNORMAL HIGH (ref 0.70–1.33)
GFR, Est African American: 55 mL/min/{1.73_m2} — ABNORMAL LOW (ref >=60)
GFR, Est Non African American: 47 mL/min/{1.73_m2} — ABNORMAL LOW (ref >=60)
GLUCOSE: 90 mg/dL (ref 65–99)
Globulin: 3.3 g/dL (calc) (ref 1.9–3.7)
Potassium: 4.1 mmol/L (ref 3.5–5.3)
Sodium: 141 mmol/L (ref 135–146)
Total Protein: 7.4 g/dL (ref 6.1–8.1)

## 2018-02-25 LAB — HEPATITIS C RNA QUANTITATIVE
HCV QUANT LOG: NOT DETECTED {Log_IU}/mL
HCV RNA, PCR, QN: NOT DETECTED [IU]/mL

## 2018-03-01 ENCOUNTER — Ambulatory Visit (INDEPENDENT_AMBULATORY_CARE_PROVIDER_SITE_OTHER): Payer: Self-pay | Admitting: Pharmacist

## 2018-03-01 DIAGNOSIS — B182 Chronic viral hepatitis C: Secondary | ICD-10-CM

## 2018-03-01 NOTE — Progress Notes (Signed)
HPI: Lawrence Cole is a 52 y.o. male [facility].   Medication: Mavyret x 8 weeks finished on 5/20.  Start Date: 09/24/17  Hepatitis C Genotype: 1a  Fibrosis Score: F2/F3  Hepatitis C RNA: 1.5 million on 07/15/17, <15 on 11/24/17 and 02/23/18.  Patient Active Problem List   Diagnosis Date Noted  . GERD (gastroesophageal reflux disease) 07/21/2017  . Abdominal pain, epigastric 07/21/2017  . Hyperlipidemia 05/31/2017  . Hepatitis C virus infection without hepatic coma 05/31/2017  . Alcohol abuse 05/31/2017  . At risk for acute ischemic cardiac event 04/30/2017  . Hepatitis C antibody test positive 04/30/2017  . Chest pain on breathing 04/27/2017  . Essential hypertension 04/27/2017  . Establishing care with new doctor, encounter for 04/27/2017  . Smoking greater than 25 pack years 04/27/2017  . Chronic pain of lower extremity, bilateral 09/12/2008    Patient's Medications  New Prescriptions   No medications on file  Previous Medications   ASPIRIN EC 81 MG TABLET    Take 1 tablet (81 mg total) by mouth daily.   ATORVASTATIN (LIPITOR) 40 MG TABLET    Take 1 tablet (40 mg total) by mouth daily.   GLECAPREVIR-PIBRENTASVIR (MAVYRET) 100-40 MG TABS    Take 3 tablets by mouth daily with breakfast.   HYDRALAZINE (APRESOLINE) 25 MG TABLET    Take 1 tablet (25 mg total) by mouth 3 (three) times daily.   IBUPROFEN (ADVIL,MOTRIN) 200 MG TABLET    Take 400 mg by mouth every 6 (six) hours as needed for mild pain.   MORPHINE (MSIR) 15 MG TABLET    Take 1 tablet (15 mg total) by mouth every 4 (four) hours as needed for severe pain.   OMEPRAZOLE (PRILOSEC) 40 MG CAPSULE    Take 1 capsule (40 mg total) by mouth daily.   POLYETHYLENE GLYCOL POWDER (GLYCOLAX/MIRALAX) POWDER    Take 17 g by mouth 2 (two) times daily as needed.   TAMSULOSIN (FLOMAX) 0.4 MG CAPS CAPSULE    Take 1 capsule (0.4 mg total) by mouth daily after supper.   TETRAHYDROZOLINE HCL (VISINE OP)    Apply 1 drop to eye daily as needed (for dry eyes).  Modified Medications   No medications on file  Discontinued Medications   No medications on file    Allergies: Allergies  Allergen Reactions  . Codeine Itching    Past Medical History: Past Medical History:  Diagnosis Date  . Hepatitis   . Hypertension     Social History: Social History   Socioeconomic History  . Marital status: Divorced    Spouse name: Not on file  . Number of children: Not on file  . Years of education: Not on file  . Highest education level: Not on file  Occupational History  . Not on file  Social Needs  . Financial resource strain: Not on file  . Food insecurity:    Worry: Not on file    Inability: Not on file  . Transportation needs:    Medical: Not on file    Non-medical: Not on file  Tobacco Use  . Smoking status: Current Every Day Smoker    Packs/day: 1.00    Years: 25.00    Pack years: 25.00  . Smokeless tobacco: Never Used  Substance and Sexual Activity  . Alcohol use: Yes    Comment: 4/day  . Drug use: Yes    Frequency: 14.0 times per week  Types: Marijuana    Comment: joints. Been doing for 15-20 years.   . Sexual activity: Yes    Birth control/protection: Condom    Comment: With women  Lifestyle  . Physical activity:    Days per week: Not on file    Minutes per session: Not on file  . Stress: Not on file  Relationships  . Social connections:    Talks on phone: Not on file    Gets together: Not on file    Attends religious service: Not on file    Active member of club or organization: Not on file    Attends meetings of clubs or organizations: Not on file    Relationship status: Not on file  Other Topics Concern  . Not on file  Social History Narrative  . Not on file    Labs: Hepatitis C Lab Results  Component Value Date   HCVGENOTYPE 1a 07/15/2017   HCVRNAPCRQN <15 NOT DETECTED 02/23/2018   HCVRNAPCRQN <15 NOT DETECTED  11/24/2017   HCVRNAPCRQN 1,350,000 (H) 07/15/2017   FIBROSTAGE F3 07/15/2017   Hepatitis B Lab Results  Component Value Date   HEPBSAB NON-REACTIVE 07/15/2017   HEPBSAG NON-REACTIVE 07/15/2017   HEPBCAB REACTIVE (A) 07/15/2017   Hepatitis A Lab Results  Component Value Date   HAV REACTIVE (A) 07/15/2017   HIV Lab Results  Component Value Date   HIV NON-REACTIVE 07/15/2017   HIV Non Reactive 04/27/2017   Lab Results  Component Value Date   CREATININE 1.63 (H) 02/23/2018   CREATININE 1.53 (H) 12/08/2017   CREATININE 1.60 (H) 11/24/2017   CREATININE 1.34 (H) 07/15/2017   CREATININE 1.59 (H) 04/27/2017   Lab Results  Component Value Date   AST 18 02/23/2018   AST 21 12/08/2017   AST 18 11/24/2017   ALT 8 (L) 02/23/2018   ALT 14 (L) 12/08/2017   ALT 9 11/24/2017   INR 1.0 07/15/2017   INR 1.1 05/31/2017    Assessment: Lawrence Cole is here today for his Hep C cure visit.  He completed 8 weeks of Mavyret on 5/20 with no issues. His SVR12 Hep C viral load was undetectable, so he is cured.  Relayed this information to him.  Counseled on the risk of reinfection and that his Hep C antibody will always be positive.   Plan: - Cured of Hepatitis C  Chee Kinslow L. Duward Allbritton, PharmD, AAHIVP, CPP Infectious Diseases Clinical Pharmacist [facility] 03/01/2018, 4:41 PM

## 2018-03-14 ENCOUNTER — Ambulatory Visit (INDEPENDENT_AMBULATORY_CARE_PROVIDER_SITE_OTHER): Payer: Self-pay | Admitting: Family Medicine

## 2018-03-14 ENCOUNTER — Other Ambulatory Visit: Payer: Self-pay

## 2018-03-14 ENCOUNTER — Encounter: Payer: Self-pay | Admitting: Family Medicine

## 2018-03-14 VITALS — BP 154/94 | HR 89 | Temp 98.1°F | Ht 71.0 in | Wt 206.0 lb

## 2018-03-14 DIAGNOSIS — K5900 Constipation, unspecified: Secondary | ICD-10-CM | POA: Insufficient documentation

## 2018-03-14 DIAGNOSIS — Z23 Encounter for immunization: Secondary | ICD-10-CM

## 2018-03-14 DIAGNOSIS — E785 Hyperlipidemia, unspecified: Secondary | ICD-10-CM

## 2018-03-14 DIAGNOSIS — N189 Chronic kidney disease, unspecified: Secondary | ICD-10-CM | POA: Insufficient documentation

## 2018-03-14 DIAGNOSIS — N183 Chronic kidney disease, stage 3 unspecified: Secondary | ICD-10-CM

## 2018-03-14 DIAGNOSIS — I10 Essential (primary) hypertension: Secondary | ICD-10-CM

## 2018-03-14 MED ORDER — POLYETHYLENE GLYCOL 3350 17 GM/SCOOP PO POWD
17.0000 g | Freq: Two times a day (BID) | ORAL | 1 refills | Status: DC | PRN
Start: 2018-03-14 — End: 2018-06-13

## 2018-03-14 MED ORDER — AMLODIPINE BESYLATE 10 MG PO TABS: 10.0000 mg | ORAL_TABLET | Freq: Every day | ORAL | 3 refills | Status: DC

## 2018-03-14 MED ORDER — SIMETHICONE 80 MG PO CHEW: 80.0000 mg | CHEWABLE_TABLET | Freq: Four times a day (QID) | ORAL | 0 refills | Status: DC | PRN

## 2018-03-14 MED ORDER — ATORVASTATIN CALCIUM 40 MG PO TABS: 40.0000 mg | ORAL_TABLET | Freq: Every day | ORAL | 3 refills | Status: DC

## 2018-03-14 NOTE — Progress Notes (Signed)
Subjective:  Hypertension  Patient here for follow-up of elevated blood pressure.  He is exercising and is not adherent to a low-salt diet.  Blood pressure is not well controlled at home.  Patient has been off antihypertensive medications for the past several months due to "interactions with his" hepatitis C medications.  Cardiac symptoms: none. Patient denies: dyspnea, exertional chest pressure/discomfort, irregular heart beat and lower extremity edema. Cardiovascular risk factors: dyslipidemia, hypertension and male gender. Use of agents associated with hypertension: none. History of target organ damage: chronic kidney disease.  Alcohol abuse Denies drinking any alcohol since her treatment for hepatitis C.  Hyperlipidemia Not currently on atorvastatin due to concerns of interaction with Hepatitis Cdrugs.  Constipation Patient reports having one bowel movement every 3 days.  Says he has to strain for this.  Today he is tried Lexapro in the past.  Says that this helps.  Says that he has to use it multiple times per week.  Denies any blood in stool.  The following portions of the patient's history were reviewed and updated as appropriate: allergies, current medications, past family history, past medical history, past social history, past surgical history and problem list.  Review of Systems Pertinent items noted in HPI and remainder of comprehensive ROS otherwise negative.     Objective:   Vitals:   03/14/18 1500 03/14/18 1501  BP: (!) 154/94 (!) 154/94  Pulse: 89   Temp: 98.1 F (36.7 C)   TempSrc: Oral   SpO2: 98%   Weight: 206 lb (93.4 kg)   Height: 5\' 11"  (1.803 m)    Gen: NAD, resting comfortably CV: RRR with no murmurs appreciated Pulm: NWOB, CTAB with no crackles, wheezes, or rhonchi GI: Normal bowel sounds present. Soft, Nontender, Nondistended. MSK: no edema, cyanosis, or clubbing noted Skin: warm, dry Neuro: grossly normal, moves all extremities Psych: Normal affect  and thought content  Assessment and Plan:  Essential hypertension Uncontrolled.   -Will start amlodipine 10 mg. -Follow-up in 1 month blood pressure check  Chronic kidney disease CKD 3A.  Hypertension uncontrolled. -Repeat BMP/renal status   Hyperlipidemia Not currently on atorvastatin due to previously being interactions with hepatitis C antivirals.  No longer on high-dose antivirals.   -Restart atorvastatin 40 mg  Constipation Constipation with flatus.  Chronic.  Improved with MiraLAX and simethicone -MiraLAX and simethicone as needed

## 2018-03-14 NOTE — Assessment & Plan Note (Signed)
Not currently on atorvastatin due to previously being interactions with hepatitis C antivirals.  No longer on high-dose antivirals.   -Restart atorvastatin 40 mg

## 2018-03-14 NOTE — Assessment & Plan Note (Signed)
Constipation with flatus.  Chronic.  Improved with MiraLAX and simethicone -MiraLAX and simethicone as needed

## 2018-03-14 NOTE — Assessment & Plan Note (Signed)
Uncontrolled.   -Will start amlodipine 10 mg. -Follow-up in 1 month blood pressure check

## 2018-03-14 NOTE — Patient Instructions (Signed)
Hypertension Hypertension is another name for high blood pressure. High blood pressure forces your heart to work harder to pump blood. This can cause problems over time. There are two numbers in a blood pressure reading. There is a top number (systolic) over a bottom number (diastolic). It is best to have a blood pressure below 120/80. Healthy choices can help lower your blood pressure. You may need medicine to help lower your blood pressure if:  Your blood pressure cannot be lowered with healthy choices.  Your blood pressure is higher than 130/80.  Follow these instructions at home: Eating and drinking  If directed, follow the DASH eating plan. This diet includes: ? Filling half of your plate at each meal with fruits and vegetables. ? Filling one quarter of your plate at each meal with whole grains. Whole grains include whole wheat pasta, brown rice, and whole grain bread. ? Eating or drinking low-fat dairy products, such as skim milk or low-fat yogurt. ? Filling one quarter of your plate at each meal with low-fat (lean) proteins. Low-fat proteins include fish, skinless chicken, eggs, beans, and tofu. ? Avoiding fatty meat, cured and processed meat, or chicken with skin. ? Avoiding premade or processed food.  Eat less than 1,500 mg of salt (sodium) a day.  Limit alcohol use to no more than 1 drink a day for nonpregnant women and 2 drinks a day for men. One drink equals 12 oz of beer, 5 oz of wine, or 1 oz of hard liquor. Lifestyle  Work with your doctor to stay at a healthy weight or to lose weight. Ask your doctor what the best weight is for you.  Get at least 30 minutes of exercise that causes your heart to beat faster (aerobic exercise) most days of the week. This may include walking, swimming, or biking.  Get at least 30 minutes of exercise that strengthens your muscles (resistance exercise) at least 3 days a week. This may include lifting weights or pilates.  Do not use any  products that contain nicotine or tobacco. This includes cigarettes and e-cigarettes. If you need help quitting, ask your doctor.  Check your blood pressure at home as told by your doctor.  Keep all follow-up visits as told by your doctor. This is important. Medicines  Take over-the-counter and prescription medicines only as told by your doctor. Follow directions carefully.  Do not skip doses of blood pressure medicine. The medicine does not work as well if you skip doses. Skipping doses also puts you at risk for problems.  Ask your doctor about side effects or reactions to medicines that you should watch for. Contact a doctor if:  You think you are having a reaction to the medicine you are taking.  You have headaches that keep coming back (recurring).  You feel dizzy.  You have swelling in your ankles.  You have trouble with your vision. Get help right away if:  You get a very bad headache.  You start to feel confused.  You feel weak or numb.  You feel faint.  You get very bad pain in your: ? Chest. ? Belly (abdomen).  You throw up (vomit) more than once.  You have trouble breathing. Summary  Hypertension is another name for high blood pressure.  Making healthy choices can help lower blood pressure. If your blood pressure cannot be controlled with healthy choices, you may need to take medicine. This information is not intended to replace advice given to you by your health care   provider. Make sure you discuss any questions you have with your health care provider. Document Released: 12/09/2007 Document Revised: 05/20/2016 Document Reviewed: 05/20/2016 Elsevier Interactive Patient Education  2018 [employer]  DASH Eating Plan DASH stands for "Dietary Approaches to Stop Hypertension." The DASH eating plan is a healthy eating plan that has been shown to reduce high blood pressure (hypertension). It may also reduce your risk for type 2 diabetes, heart disease, and  stroke. The DASH eating plan may also help with weight loss. What are tips for following this plan? General guidelines  Avoid eating more than 2,300 mg (milligrams) of salt (sodium) a day. If you have hypertension, you may need to reduce your sodium intake to 1,500 mg a day.  Limit alcohol intake to no more than 1 drink a day for nonpregnant women and 2 drinks a day for men. One drink equals 12 oz of beer, 5 oz of wine, or 1 oz of hard liquor.  Work with your health care provider to maintain a healthy body weight or to lose weight. Ask what an ideal weight is for you.  Get at least 30 minutes of exercise that causes your heart to beat faster (aerobic exercise) most days of the week. Activities may include walking, swimming, or biking.  Work with your health care provider or diet and nutrition specialist (dietitian) to adjust your eating plan to your individual calorie needs. Reading food labels  Check food labels for the amount of sodium per serving. Choose foods with less than 5 percent of the Daily Value of sodium. Generally, foods with less than 300 mg of sodium per serving fit into this eating plan.  To find whole grains, look for the word "whole" as the first word in the ingredient list. Shopping  Buy products labeled as "low-sodium" or "no salt added."  Buy fresh foods. Avoid canned foods and premade or frozen meals. Cooking  Avoid adding salt when cooking. Use salt-free seasonings or herbs instead of table salt or sea salt. Check with your health care provider or pharmacist before using salt substitutes.  Do not fry foods. Cook foods using healthy methods such as baking, boiling, grilling, and broiling instead.  Cook with heart-healthy oils, such as olive, canola, soybean, or sunflower oil. Meal planning   Eat a balanced diet that includes: ? 5 or more servings of fruits and vegetables each day. At each meal, try to fill half of your plate with fruits and vegetables. ? Up  to 6-8 servings of whole grains each day. ? Less than 6 oz of lean meat, poultry, or fish each day. A 3-oz serving of meat is about the same size as a deck of cards. One egg equals 1 oz. ? 2 servings of low-fat dairy each day. ? A serving of nuts, seeds, or beans 5 times each week. ? Heart-healthy fats. Healthy fats called Omega-3 fatty acids are found in foods such as flaxseeds and coldwater fish, like sardines, salmon, and mackerel.  Limit how much you eat of the following: ? Canned or prepackaged foods. ? Food that is high in trans fat, such as fried foods. ? Food that is high in saturated fat, such as fatty meat. ? Sweets, desserts, sugary drinks, and other foods with added sugar. ? Full-fat dairy products.  Do not salt foods before eating.  Try to eat at least 2 vegetarian meals each week.  Eat more home-cooked food and less restaurant, buffet, and fast food.  When eating at a restaurant,   ask that your food be prepared with less salt or no salt, if possible. What foods are recommended? The items listed may not be a complete list. Talk with your dietitian about what dietary choices are best for you. Grains Whole-grain or whole-wheat bread. Whole-grain or whole-wheat pasta. Brown rice. Oatmeal. Quinoa. Bulgur. Whole-grain and low-sodium cereals. Pita bread. Low-fat, low-sodium crackers. Whole-wheat flour tortillas. Vegetables Fresh or frozen vegetables (raw, steamed, roasted, or grilled). Low-sodium or reduced-sodium tomato and vegetable juice. Low-sodium or reduced-sodium tomato sauce and tomato paste. Low-sodium or reduced-sodium canned vegetables. Fruits All fresh, dried, or frozen fruit. Canned fruit in natural juice (without added sugar). Meat and other protein foods Skinless chicken or turkey. Ground chicken or turkey. Pork with fat trimmed off. Fish and seafood. Egg whites. Dried beans, peas, or lentils. Unsalted nuts, nut butters, and seeds. Unsalted canned beans. Lean cuts of  beef with fat trimmed off. Low-sodium, lean deli meat. Dairy Low-fat (1%) or fat-free (skim) milk. Fat-free, low-fat, or reduced-fat cheeses. Nonfat, low-sodium ricotta or cottage cheese. Low-fat or nonfat yogurt. Low-fat, low-sodium cheese. Fats and oils Soft margarine without trans fats. Vegetable oil. Low-fat, reduced-fat, or light mayonnaise and salad dressings (reduced-sodium). Canola, safflower, olive, soybean, and sunflower oils. Avocado. Seasoning and other foods Herbs. Spices. Seasoning mixes without salt. Unsalted popcorn and pretzels. Fat-free sweets. What foods are not recommended? The items listed may not be a complete list. Talk with your dietitian about what dietary choices are best for you. Grains Baked goods made with fat, such as croissants, muffins, or some breads. Dry pasta or rice meal packs. Vegetables Creamed or fried vegetables. Vegetables in a cheese sauce. Regular canned vegetables (not low-sodium or reduced-sodium). Regular canned tomato sauce and paste (not low-sodium or reduced-sodium). Regular tomato and vegetable juice (not low-sodium or reduced-sodium). Pickles. Olives. Fruits Canned fruit in a light or heavy syrup. Fried fruit. Fruit in cream or butter sauce. Meat and other protein foods Fatty cuts of meat. Ribs. Fried meat. Bacon. Sausage. Bologna and other processed lunch meats. Salami. Fatback. Hotdogs. Bratwurst. Salted nuts and seeds. Canned beans with added salt. Canned or smoked fish. Whole eggs or egg yolks. Chicken or turkey with skin. Dairy Whole or 2% milk, cream, and half-and-half. Whole or full-fat cream cheese. Whole-fat or sweetened yogurt. Full-fat cheese. Nondairy creamers. Whipped toppings. Processed cheese and cheese spreads. Fats and oils Butter. Stick margarine. Lard. Shortening. Ghee. Bacon fat. Tropical oils, such as coconut, palm kernel, or palm oil. Seasoning and other foods Salted popcorn and pretzels. Onion salt, garlic salt, seasoned  salt, table salt, and sea salt. Worcestershire sauce. Tartar sauce. Barbecue sauce. Teriyaki sauce. Soy sauce, including reduced-sodium. Steak sauce. Canned and packaged gravies. Fish sauce. Oyster sauce. Cocktail sauce. Horseradish that you find on the shelf. Ketchup. Mustard. Meat flavorings and tenderizers. Bouillon cubes. Hot sauce and Tabasco sauce. Premade or packaged marinades. Premade or packaged taco seasonings. Relishes. Regular salad dressings. Where to find more information:  National Heart, Lung, and [employer] Institute: www.nhlbi.nih.gov  American Heart Association: www.heart.org Summary  The DASH eating plan is a healthy eating plan that has been shown to reduce high blood pressure (hypertension). It may also reduce your risk for type 2 diabetes, heart disease, and stroke.  With the DASH eating plan, you should limit salt (sodium) intake to 2,300 mg a day. If you have hypertension, you may need to reduce your sodium intake to 1,500 mg a day.  When on the DASH eating plan, aim to eat more fresh   fruits and vegetables, whole grains, lean proteins, low-fat dairy, and heart-healthy fats.  Work with your health care provider or diet and nutrition specialist (dietitian) to adjust your eating plan to your individual calorie needs. This information is not intended to replace advice given to you by your health care provider. Make sure you discuss any questions you have with your health care provider. Document Released: 06/11/2011 Document Revised: 06/15/2016 Document Reviewed: 06/15/2016 Else[employer] Interactive Patient Education  2018 [employer]  

## 2018-03-14 NOTE — Assessment & Plan Note (Signed)
CKD 3A.  Hypertension uncontrolled. -Repeat BMP/renal status

## 2018-06-13 ENCOUNTER — Other Ambulatory Visit: Payer: Self-pay

## 2018-06-13 ENCOUNTER — Encounter: Payer: Self-pay | Admitting: Family Medicine

## 2018-06-13 ENCOUNTER — Ambulatory Visit (INDEPENDENT_AMBULATORY_CARE_PROVIDER_SITE_OTHER): Payer: Medicaid Other | Admitting: Family Medicine

## 2018-06-13 VITALS — BP 162/100 | HR 91 | Temp 98.7°F | Ht 71.0 in | Wt 207.5 lb

## 2018-06-13 DIAGNOSIS — Z0000 Encounter for general adult medical examination without abnormal findings: Secondary | ICD-10-CM | POA: Diagnosis not present

## 2018-06-13 DIAGNOSIS — N183 Chronic kidney disease, stage 3 unspecified: Secondary | ICD-10-CM

## 2018-06-13 DIAGNOSIS — Z1211 Encounter for screening for malignant neoplasm of colon: Secondary | ICD-10-CM

## 2018-06-13 DIAGNOSIS — F1010 Alcohol abuse, uncomplicated: Secondary | ICD-10-CM

## 2018-06-13 DIAGNOSIS — K5900 Constipation, unspecified: Secondary | ICD-10-CM

## 2018-06-13 DIAGNOSIS — Z0001 Encounter for general adult medical examination with abnormal findings: Secondary | ICD-10-CM

## 2018-06-13 DIAGNOSIS — I10 Essential (primary) hypertension: Secondary | ICD-10-CM

## 2018-06-13 DIAGNOSIS — E785 Hyperlipidemia, unspecified: Secondary | ICD-10-CM

## 2018-06-13 DIAGNOSIS — R1013 Epigastric pain: Secondary | ICD-10-CM

## 2018-06-13 MED ORDER — POLYETHYLENE GLYCOL 3350 17 GM/SCOOP PO POWD: 17.0000 g | Freq: Two times a day (BID) | ORAL | 1 refills | Status: DC | PRN

## 2018-06-13 MED ORDER — AMLODIPINE BESYLATE 10 MG PO TABS: 10.0000 mg | ORAL_TABLET | Freq: Every day | ORAL | 3 refills | Status: DC

## 2018-06-13 MED ORDER — ATORVASTATIN CALCIUM 40 MG PO TABS: 40.0000 mg | ORAL_TABLET | Freq: Every day | ORAL | 3 refills | Status: DC

## 2018-06-13 NOTE — Patient Instructions (Addendum)
Managing Your Hypertension Hypertension is commonly called [place] blood pressure. This is when the force of your blood pressing against the walls of your arteries is too strong. Arteries are blood vessels that carry blood from your heart throughout your body. Hypertension forces the heart to work harder to pump blood, and may cause the arteries to become narrow or stiff. Having untreated or uncontrolled hypertension can cause heart attack, stroke, kidney disease, and other problems. What are blood pressure readings? A blood pressure reading consists of a higher number over a lower number. Ideally, your blood pressure should be below 120/80. The first ("top") number is called the systolic pressure. It is a measure of the pressure in your arteries as your heart beats. The second ("bottom") number is called the diastolic pressure. It is a measure of the pressure in your arteries as the heart relaxes. What does my blood pressure reading mean? Blood pressure is classified into four stages. Based on your blood pressure reading, your health care provider may use the following stages to determine what type of treatment you need, if any. Systolic pressure and diastolic pressure are measured in a unit called mm Hg. Normal  Systolic pressure: below 300.  Diastolic pressure: below 80. Elevated  Systolic pressure: 923-300.  Diastolic pressure: below 80. Hypertension stage 1  Systolic pressure: 762-263.  Diastolic pressure: 33-54. Hypertension stage 2  Systolic pressure: 562 or above.  Diastolic pressure: 90 or above. What health risks are associated with hypertension? Managing your hypertension is an important responsibility. Uncontrolled hypertension can lead to:  A heart attack.  A stroke.  A weakened blood vessel (aneurysm).  Heart failure.  Kidney damage.  Eye damage.  Metabolic syndrome.  Memory and concentration problems.  What changes can I make to manage my  hypertension? Hypertension can be managed by making lifestyle changes and possibly by taking medicines. Your health care provider will help you make a plan to bring your blood pressure within a normal range. Eating and drinking  Eat a diet that is [place] in fiber and potassium, and low in salt (sodium), added sugar, and fat. An example eating plan is called the DASH (Dietary Approaches to Stop Hypertension) diet. To eat this way: ? Eat plenty of fresh fruits and vegetables. Try to fill half of your plate at each meal with fruits and vegetables. ? Eat whole grains, such as whole wheat pasta, brown rice, or whole grain bread. Fill about one quarter of your plate with whole grains. ? Eat low-fat diary products. ? Avoid fatty cuts of meat, processed or cured meats, and poultry with skin. Fill about one quarter of your plate with lean proteins such as fish, chicken without skin, beans, eggs, and tofu. ? Avoid premade and processed foods. These tend to be higher in sodium, added sugar, and fat.  Reduce your daily sodium intake. Most people with hypertension should eat less than 1,500 mg of sodium a day.  Limit alcohol intake to no more than 1 drink a day for nonpregnant women and 2 drinks a day for men. One drink equals 12 oz of beer, 5 oz of wine, or 1 oz of hard liquor. Lifestyle  Work with your health care provider to maintain a healthy body weight, or to lose weight. Ask what an ideal weight is for you.  Get at least 30 minutes of exercise that causes your heart to beat faster (aerobic exercise) most days of the week. Activities may include walking, swimming, or biking.  Include  exercise to strengthen your muscles (resistance exercise), such as weight lifting, as part of your weekly exercise routine. Try to do these types of exercises for 30 minutes at least 3 days a week.  Do not use any products that contain nicotine or tobacco, such as cigarettes and e-cigarettes. If you need help quitting, ask  your health care provider.  Control any long-term (chronic) conditions you have, such as high cholesterol or diabetes. Monitoring  Monitor your blood pressure at home as told by your health care provider. Your personal target blood pressure may vary depending on your medical conditions, your age, and other factors.  Have your blood pressure checked regularly, as often as told by your health care provider. Working with your health care provider  Review all the medicines you take with your health care provider because there may be side effects or interactions.  Talk with your health care provider about your diet, exercise habits, and other lifestyle factors that may be contributing to hypertension.  Visit your health care provider regularly. Your health care provider can help you create and adjust your plan for managing hypertension. Will I need medicine to control my blood pressure? Your health care provider may prescribe medicine if lifestyle changes are not enough to get your blood pressure under control, and if:  Your systolic blood pressure is 130 or higher.  Your diastolic blood pressure is 80 or higher.  Take medicines only as told by your health care provider. Follow the directions carefully. Blood pressure medicines must be taken as prescribed. The medicine does not work as well when you skip doses. Skipping doses also puts you at risk for problems. Contact a health care provider if:  You think you are having a reaction to medicines you have taken.  You have repeated (recurrent) headaches.  You feel dizzy.  You have swelling in your ankles.  You have trouble with your vision. Get help right away if:  You develop a severe headache or confusion.  You have unusual weakness or numbness, or you feel faint.  You have severe pain in your chest or abdomen.  You vomit repeatedly.  You have trouble breathing. Summary  Hypertension is when the force of blood pumping through  your arteries is too strong. If this condition is not controlled, it may put you at risk for serious complications.  Your personal target blood pressure may vary depending on your medical conditions, your age, and other factors. For most people, a normal blood pressure is less than 120/80.  Hypertension is managed by lifestyle changes, medicines, or both. Lifestyle changes include weight loss, eating a healthy, low-sodium diet, exercising more, and limiting alcohol. This information is not intended to replace advice given to you by your health care provider. Make sure you discuss any questions you have with your health care provider. Document Released: 03/16/2012 Document Revised: 05/20/2016 Document Reviewed: 05/20/2016 Elsevier Interactive Patient Education  2018 [employer].   Mantenimiento de la salud en los hombres (Health Maintenance, Male) Un estilo de vida saludable y los cuidados preventivos son importantes para la salud y Counsellor. Pregntele al mdico cul es el cronograma de exmenes peridicos adecuado para usted. QU DEBO SABER SOBRE EL PESO Y LA DIETA? Consuma una dieta saludable  Coma muchas verduras, frutas, cereales integrales, productos lcteos con bajo contenido de grasa y Associate Professor.  No consuma muchos alimentos de alto contenido de grasas slidas, azcares agregados o sal. Mantenga un peso saludable La actividad fsica habitual puede ayudarlo  a Statistician peso saludable. Deber hacer lo siguiente:  Realizar al menos de actividad fsica por semana. El ejercicio debe aumentar la frecuencia cardaca y Development worker, international aid la transpiracin (ejercicio de [place]).  Hacer ejercicios de entrenamiento de fuerza por lo [employer] por semana. Controlarse los niveles de colesterol y lpidos en la sangre  Hgase anlisis de sangre para controlar los lpidos y el colesterol cada 5aos a partir de los 35aos. Si tiene un riesgo alto de  Warehouse manager cardiopatas coronarias, debe comenzar a [employer] de [place] a los [place]. Es posible que Insurance underwriter los niveles de colesterol con mayor frecuencia si: ? Sus niveles de lpidos y colesterol son altos. ? Es mayor de [id number]. ? Tiene un riesgo alto de tener cardiopatas coronarias. QU DEBO SABER SOBRE LAS PRUEBAS DE DETECCIN DEL CNCER? Muchos tipos de cncer se pueden detectar de manera temprana y a menudo prevenirse. Cncer de pulmn  Debe someterse a pruebas de deteccin de cncer de pulmn todos los aos en los siguientes casos: ? Si fuma actualmente y lo ha hecho durante por lo menos 30aos. ? Si fue fumador que dej el hbito en el trmino de los ltimos 15aos.  Hable con el mdico sobre las opciones en relacin con los estudios de deteccin, cundo debe comenzar a Actuary y con Engineer, structural. Cncer colorrectal  Generalmente, las pruebas de deteccin habituales del cncer colorrectal comienzan a los 50aos y deben repetirse cada 5 a 10aos hasta los 75aos. Es posible que tenga que hacerse las pruebas con mayor frecuencia si se detectan formas tempranas de plipos precancerosos o pequeos bultos. Sin embargo, el mdico podr aconsejarle que lo haga antes, si tiene factores de riesgo para el cncer de colon.  El mdico puede recomendarle que use kits de prueba caseros para Recruitment consultant oculta en la materia fecal.  Se puede usar una pequea cmara en el extremo de un tubo para examinar el colon (sigmoidoscopia o colonoscopia). Este estudio [employer] formas ms tempranas de Building services engineer. Cncer de prstata y de testculo  En funcin de la edad y del [place] de salud general, el mdico puede realizarle determinados estudios de deteccin del cncer de prstata y de testculo.  Hable con el mdico sobre cualquier sntoma o acerca de las inquietudes que tenga sobre el cncer de prstata o de testculo. Cncer de piel  Revise la piel de la cabeza a  los pies con regularidad.  Informe al mdico si aparecen nuevos lunares o si nota cambios en los que ya tiene, especialmente en estos casos: ? Si hay un cambio en el tamao, la forma o el color del lunar. ? Si tiene un lunar que es ms grande que el tamao de una goma de Paramedic.  Siempre use pantalla solar. Aplquese pantalla solar de [place] y repetida a lo largo del Futures trader.  Use mangas y [employer] Data, un sombrero de ala ancha y gafas para el sol cuando est al [place], para protegerse. QU DEBO SABER SOBRE LAS CARDIOPATAS CORONARIAS, LA DIABETES Y LA HIPERTENSIN ARTERIAL?  Si usted tiene entre 18 y 39aos, debe medirse la presin arterial cada 3a 5aos. Si usted tiene 40aos o ms, debe medirse la presin arterial [employer]. Debe medirse la presin arterial dos veces: una vez cuando est en un hospital o una clnica y la otra vez cuando est en otro sitio. Registre el promedio de [employer]. Para controlar su presin arterial cuando no est en  un hospital o Paulita Cradleuna clnica, puede usar lo siguiente: ? Valere DrossUna mquina automtica para medir la presin arterial en una farmacia. ? Un monitor para medir la presin arterial en el hogar.  Hable con el mdico Lowe's Companiessobre los valores ideales de la presin arterial.  Si tiene entre 45 y 79aos, consltele al mdico si debe tomar aspirina para evitar las cardiopatas coronarias.  Hgase anlisis habituales de deteccin de la diabetes; para ello, contrlese la glucemia en ayunas. ? Si su peso es normal y tiene un bajo riesgo de padecer diabetes, realcese este anlisis cada tres aos despus de los 45aos. ? Si tiene sobrepeso y un alto riesgo de padecer diabetes, considere someterse a este anlisis antes o con mayor frecuencia.  Para los hombres que tienen entre 65 y 3875aos, y son o han sido fumadores, se recomienda un nico estudio con ecografa para Engineer, manufacturingdetectar un aneurisma de aorta abdominal (AAA). QU DEBO SABER SOBRE LA  PREVENCIN DE LAS INFECCIONES? HepatitisB Si tiene un riesgo ms alto de Primary school teachercontraer hepatitis B, debe someterse a un examen de deteccin de este virus. Hable con el mdico para determinar si corre riesgo de tener una infeccin por hepatitisB. Hepatitis C Se recomienda un anlisis de [place] para:  Todos los que nacieron entre 1947 y [phone].  Todas las personas que tengan un riesgo de haber contrado hepatitis C. Enfermedades de transmisin sexual (ETS)  Debe realizarse pruebas de Airline pilotdeteccin de las ETS todos los aos, incluidas la gonorrea y la clamidia, en estos casos: ? Es sexualmente activo y es menor de [place]24aos. ? Es mayor de 24aos, y Public affairs consultantel mdico le informa que corre riesgo de tener este tipo de infecciones. ? La actividad sexual ha cambiado desde que le hicieron la ltima prueba de deteccin y tiene un riesgo mayor de Warehouse managertener clamidia o Copygonorrea. Pregntele al mdico si usted tiene riesgo.  Consulte a su mdico para saber si tiene un alto riesgo de infectarse por el VIH. El mdico puede recomendarle un medicamento de venta con receta para ayudar a evitar la infeccin por el VIH. QU MS PUEDO HACER?  Realcese los estudios de rutina de la salud, dentales y de Wellsite geologistla vista.  Mantngase al da con las vacunas (inmunizaciones).  No consuma ningn producto que contenga tabaco, lo que incluye cigarrillos, tabaco de Theatre managermascar y Administrator, Civil Servicecigarrillos electrnicos. Si necesita ayuda para dejar de fumar, consulte al mdico.  Limite el consumo de alcohol a no ms de 2medidas por da. BorgWarnerUna medida equivale a 12 onzas de cerveza, 5onzas de vino o 1onzas de bebidas alcohlicas de alta graduacin.  No consuma drogas.  No comparta agujas.  Solicite ayuda a su mdico si necesita apoyo o informacin para abandonar las drogas.  Informe a su mdico si a menudo se siente deprimido.  Notifique a su mdico si alguna vez ha sido vctima de abuso o si no se siente seguro en su hogar. Esta informacin no tiene Microbiologistcomo fin  reemplazar el consejo del mdico. Asegrese de hacerle al mdico cualquier pregunta que tenga. Document Released: 12/19/2007 Document Revised: 07/13/2014 Document Reviewed: 03/26/2015 Elsevier Interactive Patient Education  [employer]2018 Elsevier Inc.

## 2018-06-13 NOTE — Assessment & Plan Note (Addendum)
History of CKD.  Uncontrolled hypertension likely worsening.  Will screen renal.  We will also place referral to nephrology.

## 2018-06-13 NOTE — Assessment & Plan Note (Addendum)
Ongoing alcohol abuse, although sounds better than before.  Recommend patient cut back on drinking.  Declines further resources on drinking.  Continue to monitor clinically.  Assess LFTs

## 2018-06-13 NOTE — Assessment & Plan Note (Signed)
Congratulated patient on high laxative, however recommend the patient increase fruits vegetables to decrease stool burden.  Believe this is the cause of his pain is not improved with MiraLAX in the past.  Recommend refilling MiraLAX using as needed for aspiration.

## 2018-06-13 NOTE — Assessment & Plan Note (Signed)
Uncontrolled hypertension.  Will restart amlodipine 10.  Also get recertification labs including CBC, lipid, CMP, A1c

## 2018-06-13 NOTE — Progress Notes (Signed)
Established Patient Office Visit  Subjective:  Patient ID: Lawrence GuGeorge A Langelier, male    DOB: 1965/11/26  Age: 52 y.o. MRN: [id number]  CC:  Chief Complaint  Patient presents with  . Annual Exam    HPI Lawrence Cole presents for annual physical.  Patient exercises regularly by lifting weights.  Hypertension Patient presenting here with uncontrolled hypertension.  Has had uncontrolled hypertension past.  Patient not currently on any medications.  Denies any headache, chest pain, shortness breath, lower extremity swelling.  CKD 3 Patient has baseline CKD 3.  Likely due to uncontrolled hypertension.  Denies any oliguria, hematuria, other urinary symptoms.  Alcohol use And prior encounter, patient had stopped drinking.  He has returned to drinking.  Says that he is tried drinking about 1/5-week ago went to the mid second and really doing more.  He is drinking about two 24 ounce beers a day.  Patient wishes to cut back.  Does not want any help from resources.  Abdominal pain from constipation. Patient has chronic constipation.  He has left lower quadrant pain.  Says that he is very gassy.  This is improved with MiraLAX.  Does not eat many fruits and vegetables.  Does drink a lot of water though.  Denies any melena, blood in stools, diarrhea, vomiting  Health maintenance Patient needs routine screening labs of renal function, LFTs (alcohol abuse, history of hepatitis C), colon cancer screening, stratification for heart disease including lipid profile, A1c.   ROS As per HPI, Review of Systems  All other systems reviewed and are negative.     Objective:    Physical Exam  Constitutional: He is oriented to person, place, and time. He appears well-developed and well-nourished. No distress.  Cardiovascular: Normal rate and regular rhythm.  No murmur heard. Pulmonary/Chest: Effort normal and breath sounds normal.  Abdominal: Soft. He exhibits no distension. There is no  tenderness.  Musculoskeletal: He exhibits no edema.  Increased muscle mass  Neurological: He is alert and oriented to person, place, and time.  Skin: Skin is warm and dry.  Psychiatric: He has a normal mood and affect. His behavior is normal.    BP (!) 162/100   Pulse 91   Temp 98.7 F (37.1 C) (Oral)   Ht 5\' 11"  (1.803 m)   Wt 207 lb 8 oz (94.1 kg)   SpO2 97%   BMI 28.94 kg/m  Wt Readings from Last 3 Encounters:  06/13/18 207 lb 8 oz (94.1 kg)  03/14/18 206 lb (93.4 kg)  12/08/17 198 lb (89.8 kg)    Health Maintenance Due  Topic Date Due  . TETANUS/TDAP  08/05/1982  . COLONOSCOPY  08/05/2013     Assessment & Plan:   Problem List Items Addressed This Visit      Cardiovascular and Mediastinum   Essential hypertension    Uncontrolled hypertension.  Will restart amlodipine 10.  Also get recertification labs including CBC, lipid, CMP, A1c      Relevant Medications   amLODipine (NORVASC) 10 MG tablet   atorvastatin (LIPITOR) 40 MG tablet   Other Relevant Orders   CBC with Differential   Comprehensive metabolic panel   Hemoglobin A1c     Genitourinary   Chronic kidney disease    History of CKD.  Uncontrolled hypertension likely worsening.  Will screen renal.  We will also place referral to nephrology.        Other   Hyperlipidemia   Relevant Medications   amLODipine (NORVASC) 10 MG  tablet   atorvastatin (LIPITOR) 40 MG tablet   Other Relevant Orders   CBC with Differential   Comprehensive metabolic panel   Hemoglobin A1c   Alcohol abuse    Ongoing alcohol abuse, although sounds better than before.  Recommend patient cut back on drinking.  Declines further resources on drinking.  Continue to monitor clinically.  Assess LFTs      Abdominal pain, epigastric   Constipation    Congratulated patient on high laxative, however recommend the patient increase fruits vegetables to decrease stool burden.  Believe this is the cause of his pain is not improved with  MiraLAX in the past.  Recommend refilling MiraLAX using as needed for aspiration.      Relevant Medications   polyethylene glycol powder (GLYCOLAX/MIRALAX) powder    Other Visit Diagnoses    Encounter for general adult medical examination with abnormal findings    -  Primary   Screen for colon cancer       Relevant Orders   Cologuard      Meds ordered this encounter  Medications  . amLODipine (NORVASC) 10 MG tablet    Sig: Take 1 tablet (10 mg total) by mouth daily.    Dispense:  90 tablet    Refill:  3  . atorvastatin (LIPITOR) 40 MG tablet    Sig: Take 1 tablet (40 mg total) by mouth daily.    Dispense:  90 tablet    Refill:  3  . polyethylene glycol powder (GLYCOLAX/MIRALAX) powder    Sig: Take 17 g by mouth 2 (two) times daily as needed.    Dispense:  3350 g    Refill:  1    Follow-up: Return in 4 weeks (on 07/11/2018) for BP.    Garnette Gunner, MD

## 2018-06-14 LAB — CBC WITH DIFFERENTIAL/PLATELET
BASOS: 1 %
Basophils Absolute: 0 10*3/uL (ref 0.0–0.2)
EOS (ABSOLUTE): 0.2 10*3/uL (ref 0.0–0.4)
Eos: 4 %
HEMOGLOBIN: 14 g/dL (ref 13.0–17.7)
Hematocrit: 42.6 % (ref 37.5–51.0)
IMMATURE GRANS (ABS): 0 10*3/uL (ref 0.0–0.1)
Immature Granulocytes: 0 %
LYMPHS ABS: 2.6 10*3/uL (ref 0.7–3.1)
LYMPHS: 44 %
MCH: 29.5 pg (ref 26.6–33.0)
MCHC: 32.9 g/dL (ref 31.5–35.7)
MCV: 90 fL (ref 79–97)
Monocytes Absolute: 0.4 10*3/uL (ref 0.1–0.9)
Monocytes: 7 %
NEUTROS ABS: 2.5 10*3/uL (ref 1.4–7.0)
NEUTROS PCT: 44 %
Platelets: 228 10*3/uL (ref 150–450)
RBC: 4.74 x10E6/uL (ref 4.14–5.80)
RDW: 12.4 % (ref 12.3–15.4)
WBC: 5.8 10*3/uL (ref 3.4–10.8)

## 2018-06-14 LAB — COMPREHENSIVE METABOLIC PANEL
A/G RATIO: 1.7 (ref 1.2–2.2)
ALBUMIN: 4.5 g/dL (ref 3.5–5.5)
ALK PHOS: 64 IU/L (ref 39–117)
ALT: 13 IU/L (ref 0–44)
AST: 24 IU/L (ref 0–40)
BILIRUBIN TOTAL: 0.5 mg/dL (ref 0.0–1.2)
BUN / CREAT RATIO: 13 (ref 9–20)
BUN: 18 mg/dL (ref 6–24)
CHLORIDE: 99 mmol/L (ref 96–106)
CO2: 25 mmol/L (ref 20–29)
Calcium: 9.6 mg/dL (ref 8.7–10.2)
Creatinine, Ser: 1.38 mg/dL — ABNORMAL HIGH (ref 0.76–1.27)
GFR calc non Af Amer: 58 mL/min/{1.73_m2} — ABNORMAL LOW (ref >59)
GFR, EST AFRICAN AMERICAN: 67 mL/min/{1.73_m2} (ref >59)
GLOBULIN, TOTAL: 2.6 g/dL (ref 1.5–4.5)
Glucose: 88 mg/dL (ref 65–99)
POTASSIUM: 4.3 mmol/L (ref 3.5–5.2)
SODIUM: 142 mmol/L (ref 134–144)
TOTAL PROTEIN: 7.1 g/dL (ref 6.0–8.5)

## 2018-06-14 LAB — HEMOGLOBIN A1C
Est. average glucose Bld gHb Est-mCnc: 111 mg/dL
Hgb A1c MFr Bld: 5.5 % (ref 4.8–5.6)

## 2018-06-15 ENCOUNTER — Encounter: Payer: Self-pay | Admitting: Family Medicine

## 2018-07-26 ENCOUNTER — Encounter: Payer: Self-pay | Admitting: Family Medicine

## 2018-07-26 ENCOUNTER — Ambulatory Visit (INDEPENDENT_AMBULATORY_CARE_PROVIDER_SITE_OTHER): Payer: PRIVATE HEALTH INSURANCE | Admitting: Family Medicine

## 2018-07-26 ENCOUNTER — Other Ambulatory Visit: Payer: Self-pay

## 2018-07-26 VITALS — BP 144/68 | HR 74 | Temp 98.7°F | Ht 71.0 in | Wt 210.0 lb

## 2018-07-26 DIAGNOSIS — F17210 Nicotine dependence, cigarettes, uncomplicated: Secondary | ICD-10-CM | POA: Diagnosis not present

## 2018-07-26 DIAGNOSIS — N183 Chronic kidney disease, stage 3 unspecified: Secondary | ICD-10-CM

## 2018-07-26 DIAGNOSIS — I10 Essential (primary) hypertension: Secondary | ICD-10-CM

## 2018-07-26 DIAGNOSIS — K219 Gastro-esophageal reflux disease without esophagitis: Secondary | ICD-10-CM | POA: Diagnosis not present

## 2018-07-26 DIAGNOSIS — F1010 Alcohol abuse, uncomplicated: Secondary | ICD-10-CM

## 2018-07-26 MED ORDER — SILDENAFIL CITRATE 20 MG PO TABS: 40.0000 mg | ORAL_TABLET | ORAL | 0 refills | Status: DC | PRN

## 2018-07-26 MED ORDER — OMEPRAZOLE 40 MG PO CPDR: 40.0000 mg | DELAYED_RELEASE_CAPSULE | Freq: Every day | ORAL | 3 refills | Status: DC

## 2018-07-26 MED ORDER — LISINOPRIL 10 MG PO TABS: 10.0000 mg | ORAL_TABLET | Freq: Every day | ORAL | 3 refills | Status: DC

## 2018-07-26 NOTE — Patient Instructions (Signed)
It was a pleasure to see you today! Thank you for choosing [facility] for your primary care. Lawrence Cole was seen for high blood pressure.  For your blood pressure, we are keeping you on the amlodipine, starting lisinopril, and checking your kidney function today.  I am also sending you to see a kidney specialist just to make sure that we are taking care of your kidneys appropriately.  The clinic will call to schedule an appointment with you for that.  We have written you prescription for Viagra (sildenafil).  You may take 2 tablets as needed 30 minutes before sexual activity.  Best,  Thomes Dinning, MD, MS FAMILY MEDICINE RESIDENT - PGY2 07/26/2018 2:36 PM

## 2018-07-26 NOTE — Assessment & Plan Note (Addendum)
BP still elevated in setting of compliance on amlodipine.  Will trial low-dose lisinopril.  Have patient follow-up in 1 month.  Check renal function

## 2018-07-26 NOTE — Assessment & Plan Note (Signed)
Trial low-dose omeprazole.

## 2018-07-26 NOTE — Progress Notes (Signed)
Established Patient Office Visit  Subjective:  Patient ID: Lawrence Cole, male    DOB: August 12, 1965  Age: 53 y.o. MRN: [id number]  CC:  Chief Complaint  Patient presents with  . Hypertension    HPI Lawrence Cole presents for follow-up on hypertension.  Patient also is concerned that he may have had a dermatological reaction to his medications.  Says it within the first couple days he started that he had "red bumps" on his chest.  It is now resolved.  He has no ongoing itching, trouble breathing, lower extremity swelling.  He did nothing for it to get resolved.  Patient endorses some chest pain, although it is more like heartburn.    GERD Patient not taking anything for heartburn.  Also endorses some abdominal pain as well similar to this burning.  Does not endorse any vomiting.  Endorses some backslash constipation.  Polysubstance use disorder  Patient states that he is trying to cut back on his alcohol.  Is now drinking 32 ounces of beer a day. He does smoke some marijuana occasionally.  He also smokes about a pack of cigarettes a day.  He is interested in quitting, but feels it is hard.  Is never tried to quit line or anything else.    Erectile dysfunction Patient is interested in prescription for Viagra.  Says that he is effective with with male.  Past Medical History:  Diagnosis Date  . Hepatitis   . Hypertension     Past Surgical History:  Procedure Laterality Date  . BRAIN SURGERY Right    right temporal plate and maxillary hardware?    Family History  Problem Relation Age of Onset  . Asthma Mother   . Diabetes Mother   . Hypertension Mother   . Cancer Father   . Hypertension Father     Social History   Socioeconomic History  . Marital status: Divorced    Spouse name: Not on file  . Number of children: Not on file  . Years of education: Not on file  . Highest education level: Not on file  Occupational History  . Not on file  Social Needs  .  Financial resource strain: Not on file  . Food insecurity:    Worry: Not on file    Inability: Not on file  . Transportation needs:    Medical: Not on file    Non-medical: Not on file  Tobacco Use  . Smoking status: Current Every Day Smoker    Packs/day: 1.00    Years: 25.00    Pack years: 25.00  . Smokeless tobacco: Never Used  Substance and Sexual Activity  . Alcohol use: Yes    Comment: 4/day  . Drug use: Yes    Frequency: 14.0 times per week    Types: Marijuana    Comment: joints. Been doing for 15-20 years.   . Sexual activity: Yes    Birth control/protection: Condom    Comment: With women  Lifestyle  . Physical activity:    Days per week: Not on file    Minutes per session: Not on file  . Stress: Not on file  Relationships  . Social connections:    Talks on phone: Not on file    Gets together: Not on file    Attends religious service: Not on file    Active member of club or organization: Not on file    Attends meetings of clubs or organizations: Not on file    Relationship  status: Not on file  . Intimate partner violence:    Fear of current or ex partner: Not on file    Emotionally abused: Not on file    Physically abused: Not on file    Forced sexual activity: Not on file  Other Topics Concern  . Not on file  Social History Narrative  . Not on file    Outpatient Medications Prior to Visit  Medication Sig Dispense Refill  . amLODipine (NORVASC) 10 MG tablet Take 1 tablet (10 mg total) by mouth daily. 90 tablet 3  . atorvastatin (LIPITOR) 40 MG tablet Take 1 tablet (40 mg total) by mouth daily. 90 tablet 3  . polyethylene glycol powder (GLYCOLAX/MIRALAX) powder Take 17 g by mouth 2 (two) times daily as needed. 3350 g 1  . simethicone (MYLICON) 80 MG chewable tablet Chew 1 tablet (80 mg total) by mouth every 6 (six) hours as needed for flatulence. 30 tablet 0   No facility-administered medications prior to visit.     Allergies  Allergen Reactions  .  Codeine Itching    ROS Review of Systems As per HPI   Objective:    Physical Exam  Constitutional: He appears well-developed. No distress.  HENT:  Head: Normocephalic and atraumatic.  Eyes: Conjunctivae are normal. No scleral icterus.  Neck: Normal range of motion. No JVD present.  Cardiovascular: Normal rate. Exam reveals no gallop and no friction rub.  No murmur heard. Pulmonary/Chest: Effort normal and breath sounds normal. No respiratory distress.  Abdominal: Soft. He exhibits no distension. There is no abdominal tenderness.  Musculoskeletal: Normal range of motion.        General: No tenderness or edema.  Neurological: He is alert. He exhibits normal muscle tone.  Skin: Skin is warm and dry.  Psychiatric: He has a normal mood and affect. His behavior is normal.    BP (!) 144/68   Pulse 74   Temp 98.7 F (37.1 C) (Oral)   Ht 5\' 11"  (1.803 m)   Wt 210 lb (95.3 kg)   BMI 29.29 kg/m  Wt Readings from Last 3 Encounters:  07/26/18 210 lb (95.3 kg)  06/13/18 207 lb 8 oz (94.1 kg)  03/14/18 206 lb (93.4 kg)     Health Maintenance Due  Topic Date Due  . TETANUS/TDAP  08/05/1982  . COLONOSCOPY  08/05/2013    There are no preventive care reminders to display for this patient.  No results found for: TSH Lab Results  Component Value Date   WBC 5.8 06/13/2018   HGB 14.0 06/13/2018   HCT 42.6 06/13/2018   MCV 90 06/13/2018   PLT 228 06/13/2018   Lab Results  Component Value Date   NA 142 06/13/2018   K 4.3 06/13/2018   CO2 25 06/13/2018   GLUCOSE 88 06/13/2018   BUN 18 06/13/2018   CREATININE 1.38 (H) 06/13/2018   BILITOT 0.5 06/13/2018   ALKPHOS 64 06/13/2018   AST 24 06/13/2018   ALT 13 06/13/2018   PROT 7.1 06/13/2018   ALBUMIN 4.5 06/13/2018   CALCIUM 9.6 06/13/2018   ANIONGAP 9 12/08/2017   Lab Results  Component Value Date   CHOL 136 04/27/2017   Lab Results  Component Value Date   HDL 50 04/27/2017   Lab Results  Component Value Date    LDLCALC 66 04/27/2017   Lab Results  Component Value Date   TRIG 102 04/27/2017   Lab Results  Component Value Date   CHOLHDL 2.7 04/27/2017  Lab Results  Component Value Date   HGBA1C 5.5 06/13/2018      Assessment & Plan:   Problem List Items Addressed This Visit      Cardiovascular and Mediastinum   Essential hypertension - Primary    BP still elevated in setting of compliance on amlodipine.  Will trial low-dose lisinopril.  Have patient follow-up in 1 month.  Check renal function      Relevant Medications   lisinopril (PRINIVIL,ZESTRIL) 10 MG tablet   sildenafil (REVATIO) 20 MG tablet   Other Relevant Orders   Basic Metabolic Panel     Digestive   GERD (gastroesophageal reflux disease)    Trial low-dose omeprazole.      Relevant Medications   omeprazole (PRILOSEC) 40 MG capsule     Genitourinary   Chronic kidney disease   Relevant Medications   lisinopril (PRINIVIL,ZESTRIL) 10 MG tablet   Other Relevant Orders   Basic Metabolic Panel     Other   Smoking greater than 25 pack years   Alcohol abuse    Praised patient for cutting back alcohol and wishing to stop smoking.  Provided resources for smoking cessation including 1800 quit now.  Patient not on training as of now.  Meds ordered this encounter  Medications  . lisinopril (PRINIVIL,ZESTRIL) 10 MG tablet    Sig: Take 1 tablet (10 mg total) by mouth daily.    Dispense:  90 tablet    Refill:  3  . sildenafil (REVATIO) 20 MG tablet    Sig: Take 2 tablets (40 mg total) by mouth as needed.    Dispense:  20 tablet    Refill:  0  . omeprazole (PRILOSEC) 40 MG capsule    Sig: Take 1 capsule (40 mg total) by mouth daily.    Dispense:  30 capsule    Refill:  3    Follow-up: Return in about 4 weeks (around 08/23/2018) for BP.    Garnette GunnerAaron B Tashe Purdon, MD

## 2018-07-27 LAB — BASIC METABOLIC PANEL
BUN / CREAT RATIO: 15 (ref 9–20)
BUN: 18 mg/dL (ref 6–24)
CO2: 23 mmol/L (ref 20–29)
CREATININE: 1.24 mg/dL (ref 0.76–1.27)
Calcium: 9.7 mg/dL (ref 8.7–10.2)
Chloride: 103 mmol/L (ref 96–106)
GFR calc non Af Amer: 65 mL/min/{1.73_m2} (ref >59)
GFR, EST AFRICAN AMERICAN: 76 mL/min/{1.73_m2} (ref >59)
Glucose: 94 mg/dL (ref 65–99)
POTASSIUM: 4.1 mmol/L (ref 3.5–5.2)
SODIUM: 143 mmol/L (ref 134–144)

## 2018-08-25 ENCOUNTER — Ambulatory Visit: Payer: PRIVATE HEALTH INSURANCE | Admitting: Family Medicine

## 2018-09-21 NOTE — Progress Notes (Deleted)
Called Lawrence Cole.   he did not answers, but his mother did.  I informed her that in light of recent events surround the Coronavirus and COVID19, that we at [facility] are trying to prevent the spread of the virus to our patients through unnecessary exposure at the office.  I discussed with her the nature of the patient's upcoming visit on 09/30/2018 which is for medications management for BP.   And that I felt that it was possible that we could postpone the visit for 2 months.   If he wanted to be seen in the clinic, however, that we were happy to see him for his schedule issue or for any other issue that he may have.   After our discussion, the mother said that she would relay the message onto him, but she felt that he would postpone their appointment by 2 months.  Additionally, I informed her that given the high volume of clinic encounters we are having to reschedule due to the virus, that it was best that he call to the clinic at [phone] to reschedule the appointment at his convenience.   The patient's mother acknowleged this and appreciated the call. And said that she would pass all of this along to him.

## 2018-09-30 ENCOUNTER — Ambulatory Visit: Payer: PRIVATE HEALTH INSURANCE | Admitting: Family Medicine

## 2018-12-09 ENCOUNTER — Telehealth: Payer: PRIVATE HEALTH INSURANCE | Admitting: Family Medicine

## 2018-12-09 ENCOUNTER — Other Ambulatory Visit: Payer: Self-pay

## 2018-12-19 ENCOUNTER — Ambulatory Visit: Payer: PRIVATE HEALTH INSURANCE | Admitting: Family Medicine

## 2018-12-27 ENCOUNTER — Ambulatory Visit: Payer: PRIVATE HEALTH INSURANCE | Admitting: Family Medicine

## 2018-12-27 ENCOUNTER — Ambulatory Visit: Payer: PRIVATE HEALTH INSURANCE

## 2019-01-02 ENCOUNTER — Other Ambulatory Visit: Payer: Self-pay

## 2019-01-02 ENCOUNTER — Ambulatory Visit (INDEPENDENT_AMBULATORY_CARE_PROVIDER_SITE_OTHER): Payer: PRIVATE HEALTH INSURANCE | Admitting: Family Medicine

## 2019-01-02 ENCOUNTER — Encounter: Payer: Self-pay | Admitting: Family Medicine

## 2019-01-02 VITALS — BP 142/86 | HR 81 | Wt 200.4 lb

## 2019-01-02 DIAGNOSIS — F1020 Alcohol dependence, uncomplicated: Secondary | ICD-10-CM | POA: Diagnosis not present

## 2019-01-02 DIAGNOSIS — M25561 Pain in right knee: Secondary | ICD-10-CM

## 2019-01-02 DIAGNOSIS — F320 Major depressive disorder, single episode, mild: Secondary | ICD-10-CM

## 2019-01-02 DIAGNOSIS — F1010 Alcohol abuse, uncomplicated: Secondary | ICD-10-CM

## 2019-01-02 DIAGNOSIS — M25562 Pain in left knee: Secondary | ICD-10-CM

## 2019-01-02 MED ORDER — DICLOFENAC SODIUM 1 % TD GEL: 4.0000 g | Freq: Four times a day (QID) | TRANSDERMAL | Status: DC

## 2019-01-02 MED ORDER — NALTREXONE HCL 50 MG PO TABS: 50.0000 mg | ORAL_TABLET | Freq: Every day | ORAL | 0 refills | Status: DC

## 2019-01-02 MED ORDER — ESCITALOPRAM OXALATE 10 MG PO TABS: 10.0000 mg | ORAL_TABLET | Freq: Every day | ORAL | 0 refills | Status: DC

## 2019-01-02 NOTE — Assessment & Plan Note (Signed)
Self-medicating with alcohol.  Likely due to being jobless and ongoing pandemic of COVID-19.  Patient is returning to work and suspect that his symptoms may improve especially if he is able to cut back from alcohol as well.  Also plan to trial start of SSRI as this may alleviate some symptoms and prevent substance use. -Lexapro 10 -Follow-up in 1 month with repeat PHQ 9

## 2019-01-02 NOTE — Assessment & Plan Note (Signed)
Patient interested in cutting back.  Gave website of [place] and provided phone number for alcohol and drug services in [place].  Also prescribe naltrexone for cravings.  Patient follow-up in 1 month.  Warned of potential side effects of nausea.

## 2019-01-02 NOTE — Patient Instructions (Addendum)
1. For alcohol use:  - Go to [employer] or call [facility]: [phone] - start naltrexone 50 mg a day  2. For depression:  - take lexapro 10 mg a day  3. For your blood pressure, keep working out, eating low salt diet and we'll recheck your blood pressure in 1 month.

## 2019-01-02 NOTE — Progress Notes (Signed)
    Subjective:  Lawrence Cole is a 53 y.o. male who presents to the [facility] today with a chief complaint of depression, knee pain, alcoholism.   HPI:  Knee Pain Left knee pain and swelling.  Is able walk around without any difficulty.  Cannot take any NSAIDs because of his renal dysfunction.  Has been using Aspercreme with some improvement.  Interested in trying something else stronger.  Depression  alcohol abuse Self medicating with alcohol. Getting drunk daily.Worried about the pandemic. Just started to go back to work. He has since cut back because of work. Drinks around 80 - 100 oz. interesting in pursuing treatment. PHQ9 Score: 11.  Interested in medication for depression as well.  HTN Has not taken blood pressure pills b/c ran out and has been out of work.   ROS: Per HPI  PMH: Alcohol use reviewed  Objective:  Physical Exam: BP (!) 142/86   Pulse 81   Wt 200 lb 6.4 oz (90.9 kg)   SpO2 96%   BMI 27.95 kg/m   Gen: NAD, resting comfortably CV: RRR with no murmurs appreciated Pulm: NWOB, CTAB with no crackles, wheezes, or rhonchi GI: Normal bowel sounds present. Soft, Nontender, Nondistended. MSK: Knee joint stable, able to walk without any assistance, patient is wearing a knee brace on left knee Skin: warm, dry Neuro: grossly normal, moves all extremities Psych: Normal affect and thought content  No results found for this or any previous visit (from the past 72 hour(s)).   Assessment/Plan:  Alcohol abuse Patient interested in cutting back.  Gave website of [place] and provided phone number for alcohol and drug services in [place].  Also prescribe naltrexone for cravings.  Patient follow-up in 1 month.  Warned of potential side effects of nausea.  Depression, major, single episode, mild (HCC) Self-medicating with alcohol.  Likely due to being jobless and ongoing pandemic of COVID-19.  Patient is returning to work and suspect that his symptoms may improve especially if  he is able to cut back from alcohol as well.  Also plan to trial start of SSRI as this may alleviate some symptoms and prevent substance use. -Lexapro 10 -Follow-up in 1 month with repeat PHQ 9   Lab Orders  No laboratory test(s) ordered today    Meds ordered this encounter  Medications  . escitalopram (LEXAPRO) 10 MG tablet    Sig: Take 1 tablet (10 mg total) by mouth daily.    Dispense:  30 tablet    Refill:  0  . naltrexone (DEPADE) 50 MG tablet    Sig: Take 1 tablet (50 mg total) by mouth daily.    Dispense:  30 tablet    Refill:  0  . diclofenac sodium (VOLTAREN) 1 % GEL    Sig: Apply 4 g topically 4 (four) times daily.      Marny Lowenstein, MD, [place] - PGY2 01/02/2019 4:39 PM

## 2019-02-08 ENCOUNTER — Encounter: Payer: Self-pay | Admitting: Family Medicine

## 2019-02-08 ENCOUNTER — Other Ambulatory Visit: Payer: Self-pay

## 2019-02-08 ENCOUNTER — Ambulatory Visit: Payer: PRIVATE HEALTH INSURANCE | Admitting: Family Medicine

## 2019-02-08 ENCOUNTER — Ambulatory Visit (INDEPENDENT_AMBULATORY_CARE_PROVIDER_SITE_OTHER): Payer: PRIVATE HEALTH INSURANCE | Admitting: Family Medicine

## 2019-02-08 ENCOUNTER — Ambulatory Visit (HOSPITAL_COMMUNITY)
Admission: RE | Admit: 2019-02-08 | Discharge: 2019-02-08 | Disposition: A | Discharge status: Home / Self Care | Payer: PRIVATE HEALTH INSURANCE | Source: Ambulatory Visit | Attending: Family Medicine | Admitting: Family Medicine

## 2019-02-08 VITALS — BP 158/88 | HR 72

## 2019-02-08 DIAGNOSIS — R0789 Other chest pain: Secondary | ICD-10-CM

## 2019-02-08 DIAGNOSIS — I10 Essential (primary) hypertension: Secondary | ICD-10-CM

## 2019-02-08 DIAGNOSIS — E785 Hyperlipidemia, unspecified: Secondary | ICD-10-CM | POA: Diagnosis not present

## 2019-02-08 DIAGNOSIS — R079 Chest pain, unspecified: Secondary | ICD-10-CM | POA: Insufficient documentation

## 2019-02-08 NOTE — Assessment & Plan Note (Signed)
Noted for the past few days to week whenever he is smoking or is outside.  Pain located to left side of the chest and was slightly reproducible on exam.  History and exam mostly atypical for cardiac chest pain.  Not consistent with prior symptoms of acid reflux.  Lungs clear to auscultation without symptoms concerning for pneumonia.  Pulse regular rate without hypoxia, low suspicion for PE.  Could be related to MSK pain though with questionable exertional component and strong risk factors for ACS including family member with heart attack at age 20, smoking, hyperlipidemia, hypertension, some concern for possible cardiac etiology.  EKG obtained today without noted abnormalities. Referral made to cardiology for stress test.

## 2019-02-08 NOTE — Progress Notes (Signed)
Subjective:   Patient ID: Lawrence Cole    DOB: [birth year], 53 y.o. male   MRN: [id number]  Lawrence GuGeorge A Lawrence Guritchett is a 53 y.o. male with a history of HTN, GERD, CKD, tobacco use, HLD, alcohol use d/o, depression, constipation here for   Hypertension: - Medications: lisinopril 10mg  daily, amlodipine 10mg  daily - Compliance: ran out of medication at the beginning of the pandemic and hasn't gotten refills, was out of work for a time. - Checking BP at home: no - Denies any SOB, vision changes, LE edema, medication SEs, or symptoms of hypotension - Diet: 2-3 meals per day. A few vegetables.  - 24 hour recall: woke up at 2:30-3am, breakfast at 7am (bacon and egg, toast). Snack at 11:30am (nabs) Lunch at 12pm (Chicken and tossed salad, mashed potatoes). Dinner at [employer]7pm ([employer]). Went to bed at 10pm. - Exercise: none formally, waters flowers    Chest discomfort Reports having some chest discomfort for the past few days to a week whenever he smokes or is outside.  States pain feels like a "catch" and is located under his left breast.  Denies shortness of breath, nausea. Doesn't hurt to take a deep breath.  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 No prior MI or stents. Uncle had MI at age 55, died.  Does endorse some palpations with discomfort.  Wonders if pain is from acid reflux as he notices it goes away with passing gas although he used to have issues with acid reflux in the past that he took medication for and notes this pain feels different.  He is a current everyday smoker, 1 pack of the lung cigarettes last 2-3 days, has smoked for the past 30 years.   Review of Systems:  Per HPI.  PMFSH, medications and smoking status reviewed.  Objective:   BP (!) 158/88    Pulse 72    SpO2 94%  Vitals and nursing note reviewed.  General: well nourished, well developed, in no acute distress with non-toxic appearance CV: regular rate and rhythm without  murmurs, rubs, or gallops, no lower extremity edema Lungs: clear to auscultation bilaterally with normal work of breathing Skin: warm, dry, no rashes or lesions Extremities: warm and well perfused, normal tone MSK: ROM grossly intact, strength intact, gait normal. Some tenderness to palpation to L chest, reproducible.  Neuro: Alert and oriented, speech normal  Assessment & Plan:   Essential hypertension Did not take medication today and has been out for a few months.  Per chart review has refills at his pharmacy, informed patient to pick these up.  No other medication changes made today.  Would recommend follow-up with PCP in 1 month per previous visit discussion.  Chest discomfort Noted for the past few days to week whenever he is smoking or is outside.  Pain located to left side of the chest and was slightly reproducible on exam.  History and exam mostly atypical for cardiac chest pain.  Not consistent with prior symptoms of acid reflux.  Lungs clear to auscultation without symptoms concerning for pneumonia.  Pulse regular rate without hypoxia, low suspicion for PE.  Could be related to MSK pain though with questionable exertional component and strong risk factors for ACS including family member with heart attack at age 55, smoking, hyperlipidemia, hypertension, some concern for possible cardiac etiology.  EKG obtained today without noted abnormalities. Referral made to cardiology for stress test.    Orders Placed This Encounter  Procedures  Basic Metabolic Panel   Lipid Panel   Ambulatory referral to Cardiology    Referral Priority:   Routine    Referral Type:   Consultation    Referral Reason:   Specialty Services Required    Requested Specialty:   Cardiology    Number of Visits Requested:   1   EKG 12-Lead   No orders of the defined types were placed in this encounter.   Rory Percy, DO PGY-3, [facility] 02/08/2019 5:39 PM

## 2019-02-08 NOTE — Patient Instructions (Addendum)
It was great to see you!  Our plans for today:  - Your EKG was normal. -Pick up your blood pressure medications from the pharmacy. - take ibuprofen as needed for pain. -Now is a great time to stop smoking.  You can call 1-800-quit-now for access to free counselors to help you stop smoking.  You can also make an appointment with our pharmacist, Dr. Valentina Lucks, for help in this as well. -Make an appointment with Dr. Grandville Silos in the next week or so to follow-up on your depression. -Call or go to [facility] to set up an appointment for your kidney disease. -If your chest pain gets worse or you develop shortness of breath, nausea, you should go to the ED to be seen. Due to your risk factors for having a heart attack, we are referring you to Cardiology to have a stress test done. They will call you with this appointment.   We are checking some labs today, we will call you or send you a letter if they are abnormal.   Take care and seek immediate care sooner if you develop any concerns.   Dr. [facility]

## 2019-02-08 NOTE — Assessment & Plan Note (Signed)
Did not take medication today and has been out for a few months.  Per chart review has refills at his pharmacy, informed patient to pick these up.  No other medication changes made today.  Would recommend follow-up with PCP in 1 month per previous visit discussion.

## 2019-02-09 LAB — BASIC METABOLIC PANEL
BUN/Creatinine Ratio: 13 (ref 9–20)
BUN: 16 mg/dL (ref 6–24)
CO2: 24 mmol/L (ref 20–29)
Calcium: 9.8 mg/dL (ref 8.7–10.2)
Chloride: 102 mmol/L (ref 96–106)
Creatinine, Ser: 1.2 mg/dL (ref 0.76–1.27)
GFR calc Af Amer: 78 mL/min/{1.73_m2} (ref >59)
GFR calc non Af Amer: 68 mL/min/{1.73_m2} (ref >59)
Glucose: 83 mg/dL (ref 65–99)
Potassium: 4.5 mmol/L (ref 3.5–5.2)
Sodium: 141 mmol/L (ref 134–144)

## 2019-02-09 LAB — LIPID PANEL
Chol/HDL Ratio: 2.9 ratio (ref 0.0–5.0)
Cholesterol, Total: 175 mg/dL (ref 100–199)
HDL: 61 mg/dL (ref >39)
LDL Calculated: 96 mg/dL (ref 0–99)
Triglycerides: 91 mg/dL (ref 0–149)
VLDL Cholesterol Cal: 18 mg/dL (ref 5–40)

## 2019-03-14 ENCOUNTER — Ambulatory Visit: Payer: PRIVATE HEALTH INSURANCE | Admitting: Family Medicine

## 2019-04-10 ENCOUNTER — Encounter: Payer: Self-pay | Admitting: Family Medicine

## 2019-04-10 ENCOUNTER — Ambulatory Visit (INDEPENDENT_AMBULATORY_CARE_PROVIDER_SITE_OTHER): Payer: PRIVATE HEALTH INSURANCE | Admitting: Family Medicine

## 2019-04-10 ENCOUNTER — Other Ambulatory Visit: Payer: Self-pay

## 2019-04-10 VITALS — BP 130/72 | HR 89 | Ht 71.0 in | Wt 204.2 lb

## 2019-04-10 DIAGNOSIS — I10 Essential (primary) hypertension: Secondary | ICD-10-CM | POA: Diagnosis not present

## 2019-04-10 DIAGNOSIS — M25562 Pain in left knee: Secondary | ICD-10-CM

## 2019-04-10 DIAGNOSIS — M25561 Pain in right knee: Secondary | ICD-10-CM

## 2019-04-10 DIAGNOSIS — N183 Chronic kidney disease, stage 3 unspecified: Secondary | ICD-10-CM | POA: Diagnosis not present

## 2019-04-10 DIAGNOSIS — G8929 Other chronic pain: Secondary | ICD-10-CM

## 2019-04-10 MED ORDER — AMLODIPINE BESYLATE 10 MG PO TABS: 10.0000 mg | ORAL_TABLET | Freq: Every day | ORAL | 3 refills | Status: DC

## 2019-04-10 MED ORDER — LISINOPRIL 10 MG PO TABS: 10.0000 mg | ORAL_TABLET | Freq: Every day | ORAL | 3 refills | Status: DC

## 2019-04-10 NOTE — Progress Notes (Addendum)
    Subjective:  Lawrence Cole is a 53 y.o. male who presents to the [facility] today with a chief complaint of blood pressure check.   HPI:  Hypertension Patient says he is been without his blood pressure medication for 2 months.  When asked which pharmacy use, he says that [facility] [address].  Denies any symptoms of dizziness, headache, chest pain, shortness of breath, lower extremity swelling.  Chest pain Patient was seen in August for chest pain.  He was referred to cardiology for stress test.  Says that he is not heard from cardiology referral.  Referral looks like it still in process.  We will follow-up on referral.  Patient denies any ongoing active chest pain.  Bilateral knee pain Patient with bilateral knee pain.  Chronic.  Patient takes ibuprofen and uses diclofenac gel.  Says he still having difficulty with pain.  Denies any trauma.  ROS: Per HPI   Objective:  Physical Exam: BP 130/72   Pulse 89   Ht 5\' 11"  (1.803 m)   Wt 204 lb 4 oz (92.6 kg)   SpO2 97%   BMI 28.49 kg/m    Gen: NAD, resting comfortably CV: RRR with no murmurs appreciated Pulm: NWOB, CTAB with no crackles, wheezes, or rhonchi GI: Normal bowel sounds present. Soft, Nontender, Nondistended. MSK: no edema, cyanosis, or clubbing noted, normal ROM, no effusion, nontender Skin: warm, dry Neuro: grossly normal, moves all extremities Psych: Normal affect and thought content  No results found for this or any previous visit (from the past 72 hour(s)).   Assessment/Plan:  Essential hypertension Pressure well controlled off of his regimen.  However given past elevated levels will refill.  Patient follow-up in 3 to 6 months for blood pressure check.  Chronic pain of both knees Bilateral knee pain.  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  May continue diclofenac gel, patient should avoid NSAIDs due to history of renal injury.  Will refer to physical therapy for additional evaluation and pain management.    Lab Orders  No laboratory test(s) ordered today    Meds ordered this encounter  Medications  . lisinopril (ZESTRIL) 10 MG tablet    Sig: Take 1 tablet (10 mg total) by mouth daily.    Dispense:  90 tablet    Refill:  3  . amLODipine (NORVASC) 10 MG tablet    Sig: Take 1 tablet (10 mg total) by mouth daily.    Dispense:  90 tablet    Refill:  3      Marny Lowenstein, MD, MS FAMILY MEDICINE RESIDENT - PGY3 04/10/2019 4:00 PM

## 2019-04-10 NOTE — Assessment & Plan Note (Signed)
Pressure well controlled off of his regimen.  However given past elevated levels will refill.  Patient follow-up in 3 to 6 months for blood pressure check.

## 2019-04-10 NOTE — Patient Instructions (Signed)
It was a pleasure to see you today! Thank you for choosing [facility] for your primary care. Lawrence Cole was seen for blood pressure check.  1.  We refilled your blood pressure medication.  2.  We are checking on that referral to cardiology.  3.  We are referring you to physical therapy for your knee pain.  Best,  Marny Lowenstein, MD, MS FAMILY MEDICINE RESIDENT - PGY3 04/10/2019 2:58 PM

## 2019-04-10 NOTE — Assessment & Plan Note (Signed)
Bilateral knee pain.  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  May continue diclofenac gel, patient should avoid NSAIDs due to history of renal injury.  Will refer to physical therapy for additional evaluation and pain management.

## 2019-04-18 ENCOUNTER — Emergency Department (HOSPITAL_COMMUNITY): Payer: Worker's Compensation

## 2019-04-18 ENCOUNTER — Other Ambulatory Visit: Payer: Self-pay

## 2019-04-18 ENCOUNTER — Encounter (HOSPITAL_COMMUNITY): Payer: Self-pay | Admitting: Emergency Medicine

## 2019-04-18 ENCOUNTER — Emergency Department (HOSPITAL_COMMUNITY)
Admission: EM | Admit: 2019-04-18 | Discharge: 2019-04-18 | Disposition: A | Discharge status: Home / Self Care | Payer: Worker's Compensation | Attending: Emergency Medicine | Admitting: Emergency Medicine

## 2019-04-18 DIAGNOSIS — S300XXA Contusion of lower back and pelvis, initial encounter: Secondary | ICD-10-CM | POA: Insufficient documentation

## 2019-04-18 DIAGNOSIS — T07XXXA Unspecified multiple injuries, initial encounter: Secondary | ICD-10-CM

## 2019-04-18 DIAGNOSIS — Y939 Activity, unspecified: Secondary | ICD-10-CM | POA: Insufficient documentation

## 2019-04-18 DIAGNOSIS — S025XXA Fracture of tooth (traumatic), initial encounter for closed fracture: Secondary | ICD-10-CM | POA: Diagnosis not present

## 2019-04-18 DIAGNOSIS — I129 Hypertensive chronic kidney disease with stage 1 through stage 4 chronic kidney disease, or unspecified chronic kidney disease: Secondary | ICD-10-CM | POA: Insufficient documentation

## 2019-04-18 DIAGNOSIS — S50811A Abrasion of right forearm, initial encounter: Secondary | ICD-10-CM | POA: Insufficient documentation

## 2019-04-18 DIAGNOSIS — T148XXA Other injury of unspecified body region, initial encounter: Secondary | ICD-10-CM

## 2019-04-18 DIAGNOSIS — Y929 Unspecified place or not applicable: Secondary | ICD-10-CM | POA: Diagnosis not present

## 2019-04-18 DIAGNOSIS — F17210 Nicotine dependence, cigarettes, uncomplicated: Secondary | ICD-10-CM | POA: Diagnosis not present

## 2019-04-18 DIAGNOSIS — Y999 Unspecified external cause status: Secondary | ICD-10-CM | POA: Diagnosis not present

## 2019-04-18 DIAGNOSIS — Z79899 Other long term (current) drug therapy: Secondary | ICD-10-CM | POA: Diagnosis not present

## 2019-04-18 DIAGNOSIS — S40212A Abrasion of left shoulder, initial encounter: Secondary | ICD-10-CM | POA: Diagnosis not present

## 2019-04-18 DIAGNOSIS — F1290 Cannabis use, unspecified, uncomplicated: Secondary | ICD-10-CM | POA: Insufficient documentation

## 2019-04-18 DIAGNOSIS — N189 Chronic kidney disease, unspecified: Secondary | ICD-10-CM | POA: Diagnosis not present

## 2019-04-18 LAB — I-STAT CREATININE, ED: Creatinine, Ser: 1.3 mg/dL — ABNORMAL HIGH (ref 0.61–1.24)

## 2019-04-18 MED ORDER — HYDROMORPHONE HCL 1 MG/ML IJ SOLN
1.0000 mg | Freq: Once | INTRAMUSCULAR | Status: AC
  Administered 2019-04-18: 1 mg via INTRAVENOUS
  Filled 2019-04-18: qty 1

## 2019-04-18 MED ORDER — IOHEXOL 300 MG/ML  SOLN
100.0000 mL | Freq: Once | INTRAMUSCULAR | Status: AC | PRN
  Administered 2019-04-18: 20:00:00 100 mL via INTRAVENOUS

## 2019-04-18 MED ORDER — MORPHINE SULFATE (PF) 4 MG/ML IV SOLN
4.0000 mg | Freq: Once | INTRAVENOUS | Status: AC
  Administered 2019-04-18: 16:00:00 4 mg via INTRAVENOUS
  Filled 2019-04-18: qty 1

## 2019-04-18 MED ORDER — CYCLOBENZAPRINE HCL 10 MG PO TABS: 10.0000 mg | ORAL_TABLET | Freq: Two times a day (BID) | ORAL | 0 refills | Status: AC | PRN

## 2019-04-18 MED ORDER — HYDROCODONE-ACETAMINOPHEN 5-325 MG PO TABS: 1.0000 | ORAL_TABLET | Freq: Four times a day (QID) | ORAL | 0 refills | Status: AC | PRN

## 2019-04-18 NOTE — ED Notes (Signed)
Pt transported to CT ?
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
## 2019-04-18 NOTE — ED Notes (Signed)
[phone] sister Seth Bake)
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
## 2019-04-18 NOTE — ED Notes (Signed)
Discharge instructions discussed with pt. Pt. verbalized understanding with  No questions at this time

## 2019-04-18 NOTE — ED Notes (Signed)
Got patient undress on the monitor patient is resting with call bell in reach 

## 2019-05-21 NOTE — Progress Notes (Signed)
Cardiology Office Note   Date:  05/22/2019   ID:  Lawrence Cole, DOB Nov 17, 1965, MRN [id number]  PCP:  Garnette Gunner, MD  Cardiologist:   No primary care provider on file. Referring:  Garnette Gunner, MD  Chief Complaint  Patient presents with  . Chest Pain      History of Present Illness: Lawrence Cole is a 53 y.o. male who presents for evaluation of chest pain.   He is referred by Garnette Gunner, MD.  He has no past cardiac history.  However, for several weeks he has been getting chest discomfort.  This has been sporadic.  It happens without provocation.  He does a lot of walking and that does not bring it on.  Seems to happen maybe when he takes a drag on a cigarette.  It might be improved after the passes gas.  However, it can be there all day.  It is in his left side.  It feels like a pressure.  It is 7 out of 10 in intensity at its peak.  He feels nauseated.  He has had any vomiting.  He might get short of breath but not necessarily associated with this.  He does not describe diaphoresis.  It goes away spontaneously.  It might however as mentioned last for hours.  He does not have any other associated symptoms such as palpitations, presyncope or syncope.  He never had this before.  Never had any cardiac testing.   Past Medical History:  Diagnosis Date  . Hepatitis   . Hypertension     Past Surgical History:  Procedure Laterality Date  . BRAIN SURGERY Right    right temporal plate and maxillary hardware?     Current Outpatient Medications  Medication Sig Dispense Refill  . amLODipine (NORVASC) 10 MG tablet Take 1 tablet (10 mg total) by mouth daily. 90 tablet 3  . lisinopril (ZESTRIL) 20 MG tablet Take 0.5 tablets (10 mg total) by mouth daily. 90 tablet 3   No current facility-administered medications for this visit.     Allergies:   Codeine    Social History:  The patient  reports that he has been smoking. He has a 25.00 pack-year smoking  history. He has never used smokeless tobacco. He reports current alcohol use. He reports current drug use. Frequency: 14.00 times per week. Drug: Marijuana.   Family History:  The patient's family history includes Asthma in his mother; Cancer in his father; Diabetes in his mother; Hypertension in his father and mother.    ROS:  Please see the history of present illness.   Otherwise, review of systems are positive for none.   All other systems are reviewed and negative.    PHYSICAL EXAM: VS:  BP (!) 158/98   Pulse 90   Ht 5\' 11"  (1.803 m)   Wt 213 lb (96.6 kg)   SpO2 98%   BMI 29.71 kg/m  , BMI Body mass index is 29.71 kg/m. GENERAL:  Well appearing HEENT:  Pupils equal round and reactive, fundi not visualized, oral mucosa unremarkable NECK:  No jugular venous distention, waveform within normal limits, carotid upstroke brisk and symmetric, no bruits, no thyromegaly LYMPHATICS:  No cervical, inguinal adenopathy LUNGS:  Clear to auscultation bilaterally BACK:  No CVA tenderness CHEST:  Unremarkable HEART:  PMI not displaced or sustained,S1 and S2 within normal limits, no S3, no S4, no clicks, no rubs, no murmurs ABD:  Flat, positive bowel sounds normal in  frequency in pitch, no bruits, no rebound, no guarding, no midline pulsatile mass, no hepatomegaly, no splenomegaly EXT:  2 plus pulses throughout, no edema, no cyanosis no clubbing SKIN:  No rashes no nodules NEURO:  Cranial nerves II through XII grossly intact, motor grossly intact throughout PSYCH:  Cognitively intact, oriented to person place and time    EKG:  EKG is not ordered today. The ekg ordered February 08, 2019 demonstrates sinus rhythm, rate 70, axis within normal limits, intervals within normal limits, no acute ST-T wave changes.   Recent Labs: 06/13/2018: ALT 13; Hemoglobin 14.0; Platelets 228 02/08/2019: BUN 16; Potassium 4.5; Sodium 141 04/18/2019: Creatinine, Ser 1.30    Lipid Panel    Component Value Date/Time    CHOL 175 02/08/2019 1513   TRIG 91 02/08/2019 1513   HDL 61 02/08/2019 1513   CHOLHDL 2.9 02/08/2019 1513   LDLCALC 96 02/08/2019 1513      Wt Readings from Last 3 Encounters:  05/22/19 213 lb (96.6 kg)  04/10/19 204 lb 4 oz (92.6 kg)  01/02/19 200 lb 6.4 oz (90.9 kg)      Other studies Reviewed: Additional studies/ records that were reviewed today include: Labs. Review of the above records demonstrates:  Please see elsewhere in the note.     ASSESSMENT AND PLAN:  CHEST PAIN:   The chest pain has atypical and some typical features.  His cardiovascular risk factors. POET (Plain Old Exercise Treadmill).  If this is negative we might pursue a GI evaluation if it continues.  HTN: His blood pressure is not controlled.  Increase his lisinopril to 20 mg daily.  DYSLIPIDEMIA:  LDL was mildly elevated at 96 in August.  However, his HDL is 61.  Continue meds as listed.  TOBACCO ABUSE: I discussed the need to quit smoking.  I gave him the number [place].    Current medicines are reviewed at length with the patient today.  The patient does not have concerns regarding medicines.  The following changes have been made:  no change  Labs/ tests ordered today include:   Orders Placed This Encounter  Procedures  . EXERCISE TOLERANCE TEST (ETT)     Disposition:   FU with me as needed.     Signed, Minus Breeding, MD  05/22/2019 9:08 AM    [facility]

## 2019-05-22 ENCOUNTER — Encounter: Payer: Self-pay | Admitting: Cardiology

## 2019-05-22 ENCOUNTER — Other Ambulatory Visit: Payer: Self-pay

## 2019-05-22 ENCOUNTER — Ambulatory Visit (INDEPENDENT_AMBULATORY_CARE_PROVIDER_SITE_OTHER): Payer: Self-pay | Admitting: Cardiology

## 2019-05-22 VITALS — BP 158/98 | HR 90 | Ht 71.0 in | Wt 213.0 lb

## 2019-05-22 DIAGNOSIS — E785 Hyperlipidemia, unspecified: Secondary | ICD-10-CM

## 2019-05-22 DIAGNOSIS — I10 Essential (primary) hypertension: Secondary | ICD-10-CM

## 2019-05-22 DIAGNOSIS — R079 Chest pain, unspecified: Secondary | ICD-10-CM

## 2019-05-22 MED ORDER — LISINOPRIL 20 MG PO TABS: 10.0000 mg | ORAL_TABLET | Freq: Every day | ORAL | 3 refills | Status: DC

## 2019-05-22 NOTE — Patient Instructions (Addendum)
Medication Instructions:  Increase Lisinopril to 20mg  Daily.  If you need a refill on your cardiac medications before your next appointment, please call your pharmacy.   Lab work: NONE  Testing/Procedures: Your physician has requested that you have an exercise tolerance test in 2 weeks. For further information please visit [email or web address]. Please also follow instruction sheet, as given.  You will have to have a Covid Test prior to your exercise test. This can be scheduled after your test is scheduled.  Follow-Up: At [facility], you and your health needs are our priority.  As part of our continuing mission to provide you with exceptional heart care, we have created designated Provider Care Teams.  These Care Teams include your primary Cardiologist (physician) and Advanced Practice Providers (APPs -  Physician Assistants and Nurse Practitioners) who all work together to provide you with the care you need, when you need it. You may see Dr Percival Spanish or one of the following Advanced Practice Providers on your designated Care Team:    Rosaria Ferries, PA-C  Jory Sims, DNP, ANP  Cadence Kathlen Mody, NP   Your physician wants you to follow-up as needed.  Any Other Special Instructions Will Be Listed Below (If Applicable).  Call 1-800-Quit Now  Keep a Blood Pressure Diary

## 2019-06-05 ENCOUNTER — Other Ambulatory Visit (HOSPITAL_COMMUNITY): Payer: Medicaid Other

## 2019-06-06 ENCOUNTER — Telehealth (HOSPITAL_COMMUNITY): Payer: Self-pay

## 2019-06-06 NOTE — Telephone Encounter (Signed)
Encounter complete. 

## 2019-06-08 ENCOUNTER — Inpatient Hospital Stay (HOSPITAL_COMMUNITY): Admission: RE | Admit: 2019-06-08 | Payer: Medicaid Other | Source: Ambulatory Visit

## 2019-06-22 ENCOUNTER — Telehealth (HOSPITAL_COMMUNITY): Payer: Self-pay

## 2019-06-22 NOTE — Telephone Encounter (Signed)
Encounter complete. 

## 2019-06-23 ENCOUNTER — Ambulatory Visit: Payer: Medicaid Other | Attending: Internal Medicine

## 2019-06-23 ENCOUNTER — Inpatient Hospital Stay (HOSPITAL_COMMUNITY): Admission: RE | Admit: 2019-06-23 | Payer: Medicaid Other | Source: Ambulatory Visit

## 2019-06-23 DIAGNOSIS — Z20822 Contact with and (suspected) exposure to covid-19: Secondary | ICD-10-CM

## 2019-06-25 LAB — NOVEL CORONAVIRUS, NAA: SARS-CoV-2, NAA: NOT DETECTED

## 2019-06-27 ENCOUNTER — Ambulatory Visit (HOSPITAL_COMMUNITY)
Admission: RE | Admit: 2019-06-27 | Discharge: 2019-06-27 | Disposition: A | Discharge status: Home / Self Care | Payer: Self-pay | Source: Ambulatory Visit | Attending: Cardiology | Admitting: Cardiology

## 2019-06-27 ENCOUNTER — Other Ambulatory Visit: Payer: Self-pay

## 2019-06-27 DIAGNOSIS — I10 Essential (primary) hypertension: Secondary | ICD-10-CM

## 2019-06-27 DIAGNOSIS — R079 Chest pain, unspecified: Secondary | ICD-10-CM

## 2019-06-27 LAB — EXERCISE TOLERANCE TEST
Estimated workload: 10.1 METS
Exercise duration (min): 9 min
Exercise duration (sec): 0 s
MPHR: 165 {beats}/min
Peak HR: 166 {beats}/min
Percent HR: 100 %
RPE: 17
Rest HR: 79 {beats}/min

## 2019-07-17 ENCOUNTER — Encounter: Payer: Self-pay | Admitting: Family Medicine

## 2019-07-17 ENCOUNTER — Ambulatory Visit (INDEPENDENT_AMBULATORY_CARE_PROVIDER_SITE_OTHER): Payer: Self-pay | Admitting: Family Medicine

## 2019-07-17 ENCOUNTER — Other Ambulatory Visit: Payer: Self-pay

## 2019-07-17 VITALS — BP 120/72 | HR 80 | Wt 210.2 lb

## 2019-07-17 DIAGNOSIS — I10 Essential (primary) hypertension: Secondary | ICD-10-CM

## 2019-07-17 DIAGNOSIS — G4485 Primary stabbing headache: Secondary | ICD-10-CM

## 2019-07-17 MED ORDER — AMITRIPTYLINE HCL 10 MG PO TABS: 10.0000 mg | ORAL_TABLET | Freq: Every day | ORAL | 0 refills | Status: DC
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
## 2019-07-17 NOTE — Patient Instructions (Signed)
It was a pleasure to see you today! Thank you for choosing [facility] for your primary care. Lawrence Cole was seen for blood pressure and headache.   Your blood pressure is good.   For your headache, please try the medication prescribed. You may also take tylenol as needed for pain.   Best,  Thomes Dinning, MD, MS FAMILY MEDICINE RESIDENT - PGY3 07/17/2019 2:47 PM
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
## 2019-07-19 NOTE — Assessment & Plan Note (Signed)
Blood pressure controlled on current regimen.  Patient follow-up in 3 to 6 months for blood pressure check.
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
## 2019-11-15 ENCOUNTER — Encounter: Payer: Self-pay | Admitting: Family Medicine

## 2019-11-15 ENCOUNTER — Other Ambulatory Visit: Payer: Self-pay

## 2019-11-15 ENCOUNTER — Ambulatory Visit (INDEPENDENT_AMBULATORY_CARE_PROVIDER_SITE_OTHER): Payer: Self-pay | Admitting: Family Medicine

## 2019-11-15 VITALS — BP 130/90 | HR 92 | Ht 71.0 in | Wt 195.1 lb

## 2019-11-15 DIAGNOSIS — N529 Male erectile dysfunction, unspecified: Secondary | ICD-10-CM

## 2019-11-15 DIAGNOSIS — I10 Essential (primary) hypertension: Secondary | ICD-10-CM

## 2019-11-15 MED ORDER — LISINOPRIL 20 MG PO TABS: 10.0000 mg | ORAL_TABLET | Freq: Every day | ORAL | 3 refills | Status: DC

## 2019-11-15 MED ORDER — SILDENAFIL CITRATE 50 MG PO TABS: 50.0000 mg | ORAL_TABLET | Freq: Every day | ORAL | 0 refills | Status: AC | PRN

## 2019-11-15 MED ORDER — AMLODIPINE BESYLATE 10 MG PO TABS: 10.0000 mg | ORAL_TABLET | Freq: Every day | ORAL | 3 refills | Status: DC

## 2019-11-15 NOTE — Patient Instructions (Addendum)
Check your blood pressure once a week at home.   We refilled your blood pressure medications.   We prescribed you Viagra for erectile dysfunction.

## 2019-11-15 NOTE — Progress Notes (Signed)
  Subjective:    Patient here for follow-up of elevated blood pressure.  He is not exercising and is not adherent to a low-salt diet.  Blood pressure is well controlled at home. Cardiac symptoms: none. Patient denies: chest pain, claudication, dyspnea, irregular heart beat and tachypnea. Cardiovascular risk factors: advanced age (older than 72 for men, 66 for women). Use of agents associated with hypertension: none. History of target organ damage: none.  The following portions of the patient's history were reviewed and updated as appropriate: allergies, current medications, past family history, past medical history, past social history, past surgical history and problem list.  Patient states that he has difficulty obtaining and maintaining erections.  This not associate with any other GU symptoms.  Review of Systems Pertinent items noted in HPI and remainder of comprehensive ROS otherwise negative.     Objective:    BP 130/90   Pulse 92   Ht 5\' 11"  (1.803 m)   Wt 195 lb 2 oz (88.5 kg)   SpO2 98%   BMI 27.21 kg/m   Gen: NAD, resting comfortably CV: RRR with no murmurs appreciated Pulm: NWOB, CTAB with no crackles, wheezes, or rhonchi GI: Normal bowel sounds present. Soft, Nontender, Nondistended. MSK: no edema, cyanosis, or clubbing noted Skin: warm, dry Neuro: grossly normal, moves all extremities Psych: Normal affect and thought content   Assessment:    Hypertension, normal blood pressure . Evidence of target organ damage: none.   Erectile Dysfunction   Plan:  Hypertension:  Medication: no change.   ED: Viagra PRN

## 2019-12-27 ENCOUNTER — Telehealth: Payer: Self-pay

## 2019-12-27 NOTE — Telephone Encounter (Signed)
Koleen Nimrod, patients friend/advocate, calls nurse line on behalf of patient. Koleen Nimrod reports high blood in patient over the last 5 days. Koleen Nimrod reports his BP has been staying ~164-116. The patient has been complaining of a headache over the last several days, however no blurring vision or other symptoms. Patient has been compliant with lisinopril 20mg . is wondering if he can up his lisinopril? In the meantime I advised an apt, however [place]. Given resistant BP and headaches, I advised an UC or ED evaluation. Koleen Nimrod agreed with plan.   If PCP would like to increase lisinopril, please send to [facility] in [facility].

## 2019-12-27 NOTE — Telephone Encounter (Signed)
I don't recommend increasing his medication without him being seen. I agree with patient going to the ED.

## 2020-06-06 ENCOUNTER — Other Ambulatory Visit: Payer: Self-pay

## 2020-06-06 DIAGNOSIS — I10 Essential (primary) hypertension: Secondary | ICD-10-CM

## 2020-06-06 MED ORDER — LISINOPRIL 20 MG PO TABS: 10.0000 mg | ORAL_TABLET | Freq: Every day | ORAL | 3 refills | Status: AC

## 2020-12-05 ENCOUNTER — Other Ambulatory Visit: Payer: Self-pay

## 2020-12-05 DIAGNOSIS — I10 Essential (primary) hypertension: Secondary | ICD-10-CM

## 2020-12-05 MED ORDER — AMLODIPINE BESYLATE 10 MG PO TABS: 10.0000 mg | ORAL_TABLET | Freq: Every day | ORAL | 0 refills | Status: DC

## 2020-12-28 ENCOUNTER — Other Ambulatory Visit: Payer: Self-pay | Admitting: Family Medicine

## 2020-12-28 DIAGNOSIS — I10 Essential (primary) hypertension: Secondary | ICD-10-CM

## 2020-12-30 NOTE — Telephone Encounter (Signed)
Pt needs an appointment

## 2021-01-06 ENCOUNTER — Other Ambulatory Visit: Payer: Self-pay | Admitting: Family Medicine

## 2021-01-06 DIAGNOSIS — I10 Essential (primary) hypertension: Secondary | ICD-10-CM

## 2021-07-16 ENCOUNTER — Other Ambulatory Visit: Payer: Self-pay | Admitting: Family Medicine

## 2021-07-16 DIAGNOSIS — I10 Essential (primary) hypertension: Secondary | ICD-10-CM

## 2021-07-19 NOTE — Telephone Encounter (Signed)
Pt has not been seen in office since May 2021.  He will need an appointment. 14-d supply of Amlodipine sent to pharmacy,.

## 2021-07-21 NOTE — Telephone Encounter (Signed)
Attempted to reach pt to make appt for physical. No answer. Unable to LVM due to one not being set up. Phone just rang. Will try later. Aquilla Solian, CMA

## 2021-07-21 NOTE — Telephone Encounter (Signed)
Also the number [phone] number is not a working number, so I called the home number that just rang. Aquilla Solian, CMA

## 2021-10-31 DIAGNOSIS — Z1152 Encounter for screening for COVID-19: Secondary | ICD-10-CM | POA: Diagnosis not present
